# Patient Record
Sex: Female | Born: 1970 | Race: White | Hispanic: No | Marital: Married | Smoking: Never smoker
Health system: Southern US, Community
[De-identification: ages and names within clinical notes are randomized; demographics above are authoritative.]

## PROBLEM LIST (undated history)

## (undated) DIAGNOSIS — I10 Essential (primary) hypertension: Secondary | ICD-10-CM

## (undated) DIAGNOSIS — Z8489 Family history of other specified conditions: Secondary | ICD-10-CM

## (undated) DIAGNOSIS — N939 Abnormal uterine and vaginal bleeding, unspecified: Secondary | ICD-10-CM

## (undated) DIAGNOSIS — K219 Gastro-esophageal reflux disease without esophagitis: Secondary | ICD-10-CM

## (undated) DIAGNOSIS — D649 Anemia, unspecified: Secondary | ICD-10-CM

## (undated) DIAGNOSIS — Z87442 Personal history of urinary calculi: Secondary | ICD-10-CM

## (undated) DIAGNOSIS — Z973 Presence of spectacles and contact lenses: Secondary | ICD-10-CM

## (undated) DIAGNOSIS — Z972 Presence of dental prosthetic device (complete) (partial): Secondary | ICD-10-CM

## (undated) DIAGNOSIS — E039 Hypothyroidism, unspecified: Secondary | ICD-10-CM

## (undated) DIAGNOSIS — F909 Attention-deficit hyperactivity disorder, unspecified type: Secondary | ICD-10-CM

## (undated) DIAGNOSIS — Z9889 Other specified postprocedural states: Secondary | ICD-10-CM

## (undated) HISTORY — DX: Hypothyroidism, unspecified: E03.9

## (undated) HISTORY — PX: COSMETIC SURGERY: SHX468

## (undated) HISTORY — DX: Essential (primary) hypertension: I10

---

## 1999-06-20 ENCOUNTER — Other Ambulatory Visit: Admission: RE | Admit: 1999-06-20 | Discharge: 1999-06-20 | Payer: Self-pay | Admitting: Obstetrics and Gynecology

## 2000-01-19 ENCOUNTER — Inpatient Hospital Stay (HOSPITAL_COMMUNITY): Admission: AD | Admit: 2000-01-19 | Discharge: 2000-01-19 | Payer: Self-pay | Admitting: Obstetrics and Gynecology

## 2000-01-25 ENCOUNTER — Encounter: Payer: Self-pay | Admitting: Obstetrics & Gynecology

## 2000-01-25 ENCOUNTER — Inpatient Hospital Stay (HOSPITAL_COMMUNITY): Admission: AD | Admit: 2000-01-25 | Discharge: 2000-01-25 | Payer: Self-pay | Admitting: Obstetrics & Gynecology

## 2000-03-03 ENCOUNTER — Inpatient Hospital Stay (HOSPITAL_COMMUNITY): Admission: AD | Admit: 2000-03-03 | Discharge: 2000-03-06 | Payer: Self-pay | Admitting: Obstetrics and Gynecology

## 2000-03-29 ENCOUNTER — Other Ambulatory Visit: Admission: RE | Admit: 2000-03-29 | Discharge: 2000-03-29 | Payer: Self-pay | Admitting: Obstetrics and Gynecology

## 2001-06-20 ENCOUNTER — Other Ambulatory Visit: Admission: RE | Admit: 2001-06-20 | Discharge: 2001-06-20 | Payer: Self-pay | Admitting: Obstetrics and Gynecology

## 2002-07-03 ENCOUNTER — Other Ambulatory Visit: Admission: RE | Admit: 2002-07-03 | Discharge: 2002-07-03 | Payer: Self-pay | Admitting: Obstetrics and Gynecology

## 2002-09-18 ENCOUNTER — Encounter: Admission: RE | Admit: 2002-09-18 | Discharge: 2002-09-18 | Payer: Self-pay | Admitting: Pediatrics

## 2002-09-18 ENCOUNTER — Encounter: Payer: Self-pay | Admitting: Pediatrics

## 2002-10-31 ENCOUNTER — Encounter: Payer: Self-pay | Admitting: Gastroenterology

## 2002-10-31 ENCOUNTER — Ambulatory Visit (HOSPITAL_COMMUNITY): Admission: RE | Admit: 2002-10-31 | Discharge: 2002-10-31 | Payer: Self-pay | Admitting: Gastroenterology

## 2002-11-07 ENCOUNTER — Ambulatory Visit (HOSPITAL_COMMUNITY): Admission: RE | Admit: 2002-11-07 | Discharge: 2002-11-07 | Payer: Self-pay | Admitting: Gastroenterology

## 2002-11-07 ENCOUNTER — Encounter (INDEPENDENT_AMBULATORY_CARE_PROVIDER_SITE_OTHER): Payer: Self-pay | Admitting: Specialist

## 2008-10-29 ENCOUNTER — Encounter: Admission: RE | Admit: 2008-10-29 | Discharge: 2008-10-29 | Payer: Self-pay | Admitting: Obstetrics and Gynecology

## 2010-06-06 ENCOUNTER — Encounter: Admission: RE | Admit: 2010-06-06 | Discharge: 2010-06-06 | Payer: Self-pay | Admitting: Obstetrics and Gynecology

## 2011-01-23 NOTE — Op Note (Signed)
[facility]  Patient:    Brandy Jimenez, Brandy Jimenez                   MRN: [id number] Proc. Date: 03/03/00 Attending:  Lenoard Aden                           Operative Report  PREOPERATIVE DIAGNOSIS:       Previous cesarean section.  38-week OB intrauterine pregnancy.  Desire for elective repeat.  POSTOPERATIVE DIAGNOSIS:      Previous cesarean section.  38-week OB intrauterine pregnancy.  Desire for elective repeat.  Plus pedunculated posterior uterine fibroid.  OPERATION:                    Repeat low transverse cesarean section.  SURGEON:                      Lenoard Aden, M.D.  ASSISTANT:                    Sheronette A. Cherly Hensen, M.D.  ANESTHESIA:                   Spinal by Gretta Cool., M.D.  ESTIMATED BLOOD LOSS:         1000 cc.  COMPLICATIONS:                None.  DRAINS:                       Foley.  COUNTS:                       Correct.  CONDITION:                    The patient to the recovery room in good condition.  FINDINGS:                     Fullterm living female in vertex position.  Apgars  and 9.  Pediatricians in attendance.  Placenta manually intact.  Three vessel cord noted.  Posterior pedunculated subserosal uterine fibroid with firm adhesions to the sigmoid colon in the posterior cul-de-sac.  DESCRIPTION OF PROCEDURE:     After being apprised of the risks of anesthesia, infection, bleeding, injury to abdominal organs with need for repair, the patient was brought to the operating room where she was prepped and draped in the usual  sterile fashion.  After administering spinal anesthetic, Foley catheter was placed. After this, achieving adequate anesthesia, a Pfannenstiel skin incision was made with the scalpel and carried down to the fascia which was nicked in the midline and opened transversely using Mayo scissors.  The rectus muscles were dissected sharply in the midline.  The peritoneum  entered sharply.  The bladder blade placed. The bladder flap was developed using Metzenbaum scissors sharply.  The uterus was then scored in a smile-like fashion.  Amniotomy with clear fluid.  Delivery of a fullterm living female handed to pediatricians in attendance, 7 pounds 9 ounces, receiving Apgars of 8 and 9.  Placenta delivered manually intact, three vessel ord noted.  The uterus exteriorized.  Pedunculated fibroid as noted above.  Normal tubes and ovaries.  At this time, the uterus and pericolic gutters irrigated of all blood clots subsequently removed.  The fascia was closed with 0 Vicryl in  a continuous running fashion.  The skin was closed using staples.  Dilute Marcaine solution placed.  2-0 plain suture placed in the subcutaneous tissue.  The cervix was dilated using a sponge forcep postdelivery. DD:  03/03/00 TD:  03/04/00 Job: [id number]
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
## 2011-01-23 NOTE — Discharge Summary (Signed)
[facility]  Patient:    Brandy Jimenez, Brandy Jimenez                   MRN: [id number] Adm. Date:  [id number] Disc. Date: [id number] Attending:  Lenoard Aden                           Discharge Summary  HOSPITAL COURSE:              The patient underwent uncomplicated repeat C-section March 03, 2000.  Postpartum course uncomplicated.  Hemoglobin and hematocrit within normal limits, incision healing well, tolerating a regular diet well.  Discharged to home on day 3 on prenatal vitamins, iron, and Tylox #30.  FOLLOW-UP:  In the office in one week for blood pressure check. DD:  04/10/00 TD:  04/12/00 Job: [id number]

## 2011-07-24 ENCOUNTER — Other Ambulatory Visit: Payer: Self-pay | Admitting: Family Medicine

## 2011-07-27 ENCOUNTER — Ambulatory Visit
Admission: RE | Admit: 2011-07-27 | Discharge: 2011-07-27 | Disposition: A | Discharge status: Home / Self Care | Payer: BC Managed Care – PPO | Source: Ambulatory Visit | Attending: Family Medicine | Admitting: Family Medicine

## 2011-07-27 ENCOUNTER — Other Ambulatory Visit: Payer: Self-pay

## 2011-09-14 ENCOUNTER — Encounter (INDEPENDENT_AMBULATORY_CARE_PROVIDER_SITE_OTHER): Payer: BC Managed Care – PPO | Admitting: Family Medicine

## 2011-09-14 DIAGNOSIS — I10 Essential (primary) hypertension: Secondary | ICD-10-CM

## 2011-10-04 DIAGNOSIS — I10 Essential (primary) hypertension: Secondary | ICD-10-CM | POA: Insufficient documentation

## 2011-10-04 DIAGNOSIS — E669 Obesity, unspecified: Secondary | ICD-10-CM

## 2011-10-26 ENCOUNTER — Ambulatory Visit (INDEPENDENT_AMBULATORY_CARE_PROVIDER_SITE_OTHER): Payer: BC Managed Care – PPO | Admitting: Family Medicine

## 2011-10-26 ENCOUNTER — Encounter: Payer: Self-pay | Admitting: Family Medicine

## 2011-10-26 VITALS — BP 128/88 | Temp 98.2°F | Ht 63.25 in | Wt 232.6 lb

## 2011-10-26 DIAGNOSIS — G4700 Insomnia, unspecified: Secondary | ICD-10-CM

## 2011-10-26 DIAGNOSIS — I10 Essential (primary) hypertension: Secondary | ICD-10-CM

## 2011-10-26 MED ORDER — ZOLPIDEM TARTRATE 5 MG PO TABS: 5.0000 mg | ORAL_TABLET | Freq: Every evening | ORAL | Status: DC | PRN

## 2011-10-26 NOTE — Progress Notes (Signed)
  Subjective:    Patient ID: Brandy Jimenez, female    DOB: 02/21/1971, 41 y.o.   MRN: [id number]  HPI Patient presents in routine follow up of HTN. Compliant with medications without S/E Beginning to exercise Monitoring sodium adn cholesterol in diet.  Complains of insomnia and would like sleep agent Denies depressive symptoms, RLS or snoring associated with daytime somnolence    Review of Systems     Objective:   Physical Exam  Constitutional: She appears well-developed and well-nourished.  Neck: Neck supple.  Cardiovascular: Normal rate, regular rhythm and normal heart sounds.   Pulmonary/Chest: Effort normal and breath sounds normal.  Neurological: She is alert.  Skin: Skin is warm.          Assessment & Plan:  HTN Insomnia  Doing well RF medications for 6 months  Ambien 5 mg qhs prn Will see patient back in August

## 2011-10-30 ENCOUNTER — Other Ambulatory Visit: Payer: Self-pay | Admitting: Obstetrics and Gynecology

## 2011-10-30 DIAGNOSIS — Z1231 Encounter for screening mammogram for malignant neoplasm of breast: Secondary | ICD-10-CM

## 2011-11-09 ENCOUNTER — Ambulatory Visit: Payer: BC Managed Care – PPO

## 2011-11-16 ENCOUNTER — Ambulatory Visit
Admission: RE | Admit: 2011-11-16 | Discharge: 2011-11-16 | Disposition: A | Discharge status: Home / Self Care | Payer: BC Managed Care – PPO | Source: Ambulatory Visit | Attending: Obstetrics and Gynecology | Admitting: Obstetrics and Gynecology

## 2011-11-16 DIAGNOSIS — Z1231 Encounter for screening mammogram for malignant neoplasm of breast: Secondary | ICD-10-CM

## 2012-03-18 ENCOUNTER — Other Ambulatory Visit: Payer: Self-pay | Admitting: *Deleted

## 2012-03-18 MED ORDER — LOSARTAN POTASSIUM-HCTZ 100-12.5 MG PO TABS: 1.0000 | ORAL_TABLET | Freq: Every day | ORAL | Status: DC

## 2012-03-18 MED ORDER — VERAPAMIL HCL ER 120 MG PO CP24: 120.0000 mg | ORAL_CAPSULE | Freq: Every day | ORAL | Status: DC

## 2012-04-18 ENCOUNTER — Ambulatory Visit: Payer: BC Managed Care – PPO | Admitting: Family Medicine

## 2014-06-05 ENCOUNTER — Emergency Department (HOSPITAL_COMMUNITY)
Admission: EM | Admit: 2014-06-05 | Discharge: 2014-06-05 | Disposition: A | Discharge status: Home / Self Care | Payer: BC Managed Care – PPO | Attending: Emergency Medicine | Admitting: Emergency Medicine

## 2014-06-05 ENCOUNTER — Encounter (HOSPITAL_COMMUNITY): Payer: Self-pay | Admitting: Emergency Medicine

## 2014-06-05 DIAGNOSIS — R030 Elevated blood-pressure reading, without diagnosis of hypertension: Secondary | ICD-10-CM | POA: Diagnosis present

## 2014-06-05 DIAGNOSIS — I10 Essential (primary) hypertension: Secondary | ICD-10-CM | POA: Diagnosis not present

## 2014-06-05 DIAGNOSIS — H1130 Conjunctival hemorrhage, unspecified eye: Secondary | ICD-10-CM | POA: Insufficient documentation

## 2014-06-05 LAB — I-STAT CHEM 8, ED
BUN: 10 mg/dL (ref 6–23)
CALCIUM ION: 1.02 mmol/L — AB (ref 1.12–1.23)
Chloride: 102 mEq/L (ref 96–112)
Creatinine, Ser: 0.6 mg/dL (ref 0.50–1.10)
Glucose, Bld: 112 mg/dL — ABNORMAL HIGH (ref 70–99)
HCT: 37 % (ref 36.0–46.0)
HEMOGLOBIN: 12.6 g/dL (ref 12.0–15.0)
Potassium: 3.5 mEq/L — ABNORMAL LOW (ref 3.7–5.3)
SODIUM: 140 meq/L (ref 137–147)
TCO2: 24 mmol/L (ref 0–100)

## 2014-06-05 MED ORDER — LISINOPRIL 10 MG PO TABS: 10.0000 mg | ORAL_TABLET | Freq: Every day | ORAL | Status: DC

## 2014-06-05 MED ORDER — LISINOPRIL 10 MG PO TABS
10.0000 mg | ORAL_TABLET | Freq: Once | ORAL | Status: AC
  Administered 2014-06-05: 10 mg via ORAL
  Filled 2014-06-05: qty 1
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
## 2014-06-05 NOTE — ED Provider Notes (Signed)
43 year old female presents with asymptomatic hypertension. She was seen at the ophthalmologist earlier in the day because of subconjunctival hemorrhage, noted to be severely hypertensive over 270 systolic, states that she's been having no other symptoms including no neurologic complaints, no chest pain shortness of breath or trouble urinating. No headaches, no blurred vision. On exam the patient has a soft abdomen, clear lung sounds without murmurs, normal strength sensation speech and coordination. We'll check renal function prior to starting lisinopril, the patient was given the risks and benefits of this medication including the small risk of angioedema and has agreed. Her last blood pressure was 160/98, while this may be situational as her mother is admitted upstairs in the hospital at this time we'll start a low dose and have the patient follow up closely. She is in agreement with the plan.  Filed Vitals:   06/05/14 1905 06/05/14 1945 06/05/14 2015  BP: 174/110 172/76 166/92  Pulse: 107 94 88  Temp: 98.3 F (36.8 C)    TempSrc: Oral    Resp: 18 25 14   SpO2: 100% 100% 100%    Meds given in ED:  Medications  lisinopril (PRINIVIL,ZESTRIL) tablet 10 mg (10 mg Oral Given 06/05/14 2115)    New Prescriptions   LISINOPRIL (PRINIVIL,ZESTRIL) 10 MG TABLET    Take 1 tablet (10 mg total) by mouth daily.      I saw and evaluated the patient, reviewed the resident's note and I agree with the findings and plan.    [name], MD 06/05/14 2149

## 2014-06-05 NOTE — ED Notes (Signed)
Pt. Refused wheelchair 

## 2014-06-05 NOTE — Discharge Instructions (Signed)
Please call your doctor for a followup appointment within 24-48 hours. When you talk to your doctor please let them know that you were seen in the emergency department and have them acquire all of your records so that they can discuss the findings with you and formulate a treatment plan to fully care for your new and ongoing problems. ° °
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
## 2014-06-06 NOTE — ED Provider Notes (Signed)
I saw and evaluated the patient, reviewed the resident's note and I agree with the findings and plan.  Please see my separate note regarding my evaluation of the patient.    [name], MD 06/06/14 [phone]

## 2015-06-17 ENCOUNTER — Other Ambulatory Visit: Payer: Self-pay

## 2015-06-17 DIAGNOSIS — Z1231 Encounter for screening mammogram for malignant neoplasm of breast: Secondary | ICD-10-CM

## 2015-07-08 ENCOUNTER — Ambulatory Visit
Admission: RE | Admit: 2015-07-08 | Discharge: 2015-07-08 | Disposition: A | Discharge status: Home / Self Care | Payer: BC Managed Care – PPO | Source: Ambulatory Visit

## 2015-07-08 DIAGNOSIS — Z1231 Encounter for screening mammogram for malignant neoplasm of breast: Secondary | ICD-10-CM

## 2016-06-19 ENCOUNTER — Other Ambulatory Visit: Payer: Self-pay | Admitting: Obstetrics and Gynecology

## 2016-06-19 DIAGNOSIS — Z1231 Encounter for screening mammogram for malignant neoplasm of breast: Secondary | ICD-10-CM

## 2016-07-17 ENCOUNTER — Ambulatory Visit
Admission: RE | Admit: 2016-07-17 | Discharge: 2016-07-17 | Disposition: A | Discharge status: Home / Self Care | Payer: BC Managed Care – PPO | Source: Ambulatory Visit | Attending: Obstetrics and Gynecology | Admitting: Obstetrics and Gynecology

## 2016-07-17 DIAGNOSIS — Z1231 Encounter for screening mammogram for malignant neoplasm of breast: Secondary | ICD-10-CM

## 2016-08-13 LAB — HM COLONOSCOPY

## 2017-03-11 DIAGNOSIS — R799 Abnormal finding of blood chemistry, unspecified: Secondary | ICD-10-CM | POA: Diagnosis not present

## 2017-03-11 DIAGNOSIS — E559 Vitamin D deficiency, unspecified: Secondary | ICD-10-CM | POA: Diagnosis not present

## 2017-03-11 DIAGNOSIS — M25551 Pain in right hip: Secondary | ICD-10-CM | POA: Diagnosis not present

## 2017-03-11 DIAGNOSIS — E782 Mixed hyperlipidemia: Secondary | ICD-10-CM | POA: Diagnosis not present

## 2017-03-11 DIAGNOSIS — I10 Essential (primary) hypertension: Secondary | ICD-10-CM | POA: Diagnosis not present

## 2017-06-17 DIAGNOSIS — R5383 Other fatigue: Secondary | ICD-10-CM | POA: Diagnosis not present

## 2017-06-17 DIAGNOSIS — I10 Essential (primary) hypertension: Secondary | ICD-10-CM | POA: Diagnosis not present

## 2017-06-17 DIAGNOSIS — E038 Other specified hypothyroidism: Secondary | ICD-10-CM | POA: Diagnosis not present

## 2017-06-17 DIAGNOSIS — E559 Vitamin D deficiency, unspecified: Secondary | ICD-10-CM | POA: Diagnosis not present

## 2017-06-17 DIAGNOSIS — R799 Abnormal finding of blood chemistry, unspecified: Secondary | ICD-10-CM | POA: Diagnosis not present

## 2017-06-17 DIAGNOSIS — D508 Other iron deficiency anemias: Secondary | ICD-10-CM | POA: Diagnosis not present

## 2017-06-28 ENCOUNTER — Other Ambulatory Visit: Payer: Self-pay | Admitting: Obstetrics and Gynecology

## 2017-06-28 DIAGNOSIS — Z1231 Encounter for screening mammogram for malignant neoplasm of breast: Secondary | ICD-10-CM

## 2017-07-19 ENCOUNTER — Ambulatory Visit
Admission: RE | Admit: 2017-07-19 | Discharge: 2017-07-19 | Disposition: A | Discharge status: Home / Self Care | Payer: BLUE CROSS/BLUE SHIELD | Source: Ambulatory Visit | Attending: Obstetrics and Gynecology | Admitting: Obstetrics and Gynecology

## 2017-07-19 DIAGNOSIS — Z1231 Encounter for screening mammogram for malignant neoplasm of breast: Secondary | ICD-10-CM | POA: Diagnosis not present

## 2017-08-20 DIAGNOSIS — R21 Rash and other nonspecific skin eruption: Secondary | ICD-10-CM | POA: Diagnosis not present

## 2017-10-15 DIAGNOSIS — D508 Other iron deficiency anemias: Secondary | ICD-10-CM | POA: Diagnosis not present

## 2017-10-15 DIAGNOSIS — E038 Other specified hypothyroidism: Secondary | ICD-10-CM | POA: Diagnosis not present

## 2017-10-15 DIAGNOSIS — E559 Vitamin D deficiency, unspecified: Secondary | ICD-10-CM | POA: Diagnosis not present

## 2017-10-17 DIAGNOSIS — J411 Mucopurulent chronic bronchitis: Secondary | ICD-10-CM | POA: Diagnosis not present

## 2017-12-23 DIAGNOSIS — J209 Acute bronchitis, unspecified: Secondary | ICD-10-CM | POA: Diagnosis not present

## 2018-02-07 DIAGNOSIS — H15102 Unspecified episcleritis, left eye: Secondary | ICD-10-CM | POA: Diagnosis not present

## 2018-02-17 DIAGNOSIS — E038 Other specified hypothyroidism: Secondary | ICD-10-CM | POA: Diagnosis not present

## 2018-02-17 DIAGNOSIS — D508 Other iron deficiency anemias: Secondary | ICD-10-CM | POA: Diagnosis not present

## 2018-02-17 DIAGNOSIS — I10 Essential (primary) hypertension: Secondary | ICD-10-CM | POA: Diagnosis not present

## 2018-02-17 DIAGNOSIS — E559 Vitamin D deficiency, unspecified: Secondary | ICD-10-CM | POA: Diagnosis not present

## 2018-03-14 DIAGNOSIS — R109 Unspecified abdominal pain: Secondary | ICD-10-CM | POA: Diagnosis not present

## 2018-03-14 DIAGNOSIS — N200 Calculus of kidney: Secondary | ICD-10-CM | POA: Diagnosis not present

## 2018-06-27 DIAGNOSIS — L821 Other seborrheic keratosis: Secondary | ICD-10-CM | POA: Diagnosis not present

## 2018-06-27 DIAGNOSIS — L700 Acne vulgaris: Secondary | ICD-10-CM | POA: Diagnosis not present

## 2018-10-27 DIAGNOSIS — R799 Abnormal finding of blood chemistry, unspecified: Secondary | ICD-10-CM | POA: Diagnosis not present

## 2018-10-27 DIAGNOSIS — E038 Other specified hypothyroidism: Secondary | ICD-10-CM | POA: Diagnosis not present

## 2018-10-27 DIAGNOSIS — E782 Mixed hyperlipidemia: Secondary | ICD-10-CM | POA: Diagnosis not present

## 2018-10-27 DIAGNOSIS — E559 Vitamin D deficiency, unspecified: Secondary | ICD-10-CM | POA: Diagnosis not present

## 2018-10-27 DIAGNOSIS — I10 Essential (primary) hypertension: Secondary | ICD-10-CM | POA: Diagnosis not present

## 2018-10-27 DIAGNOSIS — D508 Other iron deficiency anemias: Secondary | ICD-10-CM | POA: Diagnosis not present

## 2018-11-09 ENCOUNTER — Other Ambulatory Visit: Payer: Self-pay | Admitting: Obstetrics and Gynecology

## 2018-11-09 DIAGNOSIS — Z1231 Encounter for screening mammogram for malignant neoplasm of breast: Secondary | ICD-10-CM

## 2018-11-28 ENCOUNTER — Ambulatory Visit: Payer: BLUE CROSS/BLUE SHIELD

## 2018-12-12 DIAGNOSIS — Z124 Encounter for screening for malignant neoplasm of cervix: Secondary | ICD-10-CM | POA: Diagnosis not present

## 2018-12-12 DIAGNOSIS — Z1151 Encounter for screening for human papillomavirus (HPV): Secondary | ICD-10-CM | POA: Diagnosis not present

## 2018-12-12 DIAGNOSIS — Z01419 Encounter for gynecological examination (general) (routine) without abnormal findings: Secondary | ICD-10-CM | POA: Diagnosis not present

## 2018-12-12 DIAGNOSIS — Z6839 Body mass index (BMI) 39.0-39.9, adult: Secondary | ICD-10-CM | POA: Diagnosis not present

## 2019-01-02 ENCOUNTER — Ambulatory Visit: Payer: BLUE CROSS/BLUE SHIELD

## 2019-01-25 DIAGNOSIS — E038 Other specified hypothyroidism: Secondary | ICD-10-CM | POA: Diagnosis not present

## 2019-02-16 ENCOUNTER — Ambulatory Visit
Admission: RE | Admit: 2019-02-16 | Discharge: 2019-02-16 | Disposition: A | Discharge status: Home / Self Care | Payer: BC Managed Care – PPO | Source: Ambulatory Visit | Attending: Obstetrics and Gynecology | Admitting: Obstetrics and Gynecology

## 2019-02-16 ENCOUNTER — Other Ambulatory Visit: Payer: Self-pay

## 2019-02-16 DIAGNOSIS — Z1231 Encounter for screening mammogram for malignant neoplasm of breast: Secondary | ICD-10-CM | POA: Diagnosis not present

## 2019-04-19 DIAGNOSIS — S81811A Laceration without foreign body, right lower leg, initial encounter: Secondary | ICD-10-CM | POA: Diagnosis not present

## 2019-04-19 DIAGNOSIS — Z23 Encounter for immunization: Secondary | ICD-10-CM | POA: Diagnosis not present

## 2019-04-19 DIAGNOSIS — Y93H9 Activity, other involving exterior property and land maintenance, building and construction: Secondary | ICD-10-CM | POA: Diagnosis not present

## 2019-04-19 DIAGNOSIS — I10 Essential (primary) hypertension: Secondary | ICD-10-CM | POA: Diagnosis not present

## 2019-04-19 DIAGNOSIS — W269XXA Contact with unspecified sharp object(s), initial encounter: Secondary | ICD-10-CM | POA: Diagnosis not present

## 2019-04-19 DIAGNOSIS — E039 Hypothyroidism, unspecified: Secondary | ICD-10-CM | POA: Diagnosis not present

## 2019-09-22 DIAGNOSIS — E782 Mixed hyperlipidemia: Secondary | ICD-10-CM | POA: Diagnosis not present

## 2019-09-22 DIAGNOSIS — E559 Vitamin D deficiency, unspecified: Secondary | ICD-10-CM | POA: Diagnosis not present

## 2019-09-22 DIAGNOSIS — D508 Other iron deficiency anemias: Secondary | ICD-10-CM | POA: Diagnosis not present

## 2019-09-22 DIAGNOSIS — I10 Essential (primary) hypertension: Secondary | ICD-10-CM | POA: Diagnosis not present

## 2019-09-22 DIAGNOSIS — E038 Other specified hypothyroidism: Secondary | ICD-10-CM | POA: Diagnosis not present

## 2019-11-01 ENCOUNTER — Telehealth: Payer: Self-pay

## 2019-11-01 ENCOUNTER — Other Ambulatory Visit: Payer: Self-pay | Admitting: Physician Assistant

## 2019-11-01 ENCOUNTER — Other Ambulatory Visit: Payer: Self-pay

## 2019-11-01 MED ORDER — LEVOTHYROXINE SODIUM 75 MCG PO TABS: 75.0000 ug | ORAL_TABLET | Freq: Every day | ORAL | 1 refills | Status: DC

## 2019-11-01 MED ORDER — LEVOTHYROXINE SODIUM 75 MCG PO TABS: 75.0000 ug | ORAL_TABLET | Freq: Every day | ORAL | 0 refills | Status: DC

## 2019-11-01 NOTE — Telephone Encounter (Signed)
Sent to both 

## 2019-11-06 ENCOUNTER — Other Ambulatory Visit: Payer: Self-pay | Admitting: Physician Assistant

## 2020-04-11 ENCOUNTER — Other Ambulatory Visit: Payer: Self-pay | Admitting: Physician Assistant

## 2020-07-01 ENCOUNTER — Other Ambulatory Visit: Payer: Self-pay | Admitting: Obstetrics and Gynecology

## 2020-07-01 DIAGNOSIS — Z1231 Encounter for screening mammogram for malignant neoplasm of breast: Secondary | ICD-10-CM

## 2020-07-10 ENCOUNTER — Other Ambulatory Visit: Payer: Self-pay | Admitting: Physician Assistant

## 2020-08-13 ENCOUNTER — Ambulatory Visit
Admission: RE | Admit: 2020-08-13 | Discharge: 2020-08-13 | Disposition: A | Discharge status: Home / Self Care | Payer: BC Managed Care – PPO | Source: Ambulatory Visit | Attending: Obstetrics and Gynecology | Admitting: Obstetrics and Gynecology

## 2020-08-13 ENCOUNTER — Other Ambulatory Visit: Payer: Self-pay

## 2020-08-13 DIAGNOSIS — Z1231 Encounter for screening mammogram for malignant neoplasm of breast: Secondary | ICD-10-CM

## 2020-08-26 ENCOUNTER — Ambulatory Visit: Payer: BC Managed Care – PPO

## 2020-09-02 DIAGNOSIS — Z20828 Contact with and (suspected) exposure to other viral communicable diseases: Secondary | ICD-10-CM | POA: Diagnosis not present

## 2020-09-02 DIAGNOSIS — J329 Chronic sinusitis, unspecified: Secondary | ICD-10-CM | POA: Diagnosis not present

## 2020-09-03 ENCOUNTER — Other Ambulatory Visit: Payer: Self-pay | Admitting: Physician Assistant

## 2020-10-31 ENCOUNTER — Other Ambulatory Visit: Payer: Self-pay | Admitting: Physician Assistant

## 2020-12-30 DIAGNOSIS — S0232XS Fracture of orbital floor, left side, sequela: Secondary | ICD-10-CM | POA: Diagnosis not present

## 2020-12-30 DIAGNOSIS — H02535 Eyelid retraction left lower eyelid: Secondary | ICD-10-CM | POA: Diagnosis not present

## 2020-12-30 DIAGNOSIS — H5712 Ocular pain, left eye: Secondary | ICD-10-CM | POA: Diagnosis not present

## 2021-01-09 DIAGNOSIS — M7989 Other specified soft tissue disorders: Secondary | ICD-10-CM | POA: Diagnosis not present

## 2021-01-09 DIAGNOSIS — J3489 Other specified disorders of nose and nasal sinuses: Secondary | ICD-10-CM | POA: Diagnosis not present

## 2021-01-09 DIAGNOSIS — J320 Chronic maxillary sinusitis: Secondary | ICD-10-CM | POA: Diagnosis not present

## 2021-01-09 DIAGNOSIS — M799 Soft tissue disorder, unspecified: Secondary | ICD-10-CM | POA: Diagnosis not present

## 2021-01-09 DIAGNOSIS — S0232XS Fracture of orbital floor, left side, sequela: Secondary | ICD-10-CM | POA: Diagnosis not present

## 2021-01-09 DIAGNOSIS — J321 Chronic frontal sinusitis: Secondary | ICD-10-CM | POA: Diagnosis not present

## 2021-02-18 DIAGNOSIS — M79672 Pain in left foot: Secondary | ICD-10-CM | POA: Diagnosis not present

## 2021-02-24 ENCOUNTER — Other Ambulatory Visit: Payer: Self-pay

## 2021-02-24 ENCOUNTER — Ambulatory Visit (INDEPENDENT_AMBULATORY_CARE_PROVIDER_SITE_OTHER): Payer: BC Managed Care – PPO

## 2021-02-24 ENCOUNTER — Ambulatory Visit: Payer: BC Managed Care – PPO | Admitting: Physician Assistant

## 2021-02-24 ENCOUNTER — Encounter: Payer: Self-pay | Admitting: Physician Assistant

## 2021-02-24 VITALS — BP 124/84 | HR 72 | Temp 97.2°F | Resp 16 | Ht 62.5 in | Wt 238.2 lb

## 2021-02-24 DIAGNOSIS — E039 Hypothyroidism, unspecified: Secondary | ICD-10-CM | POA: Diagnosis not present

## 2021-02-24 DIAGNOSIS — H5712 Ocular pain, left eye: Secondary | ICD-10-CM | POA: Diagnosis not present

## 2021-02-24 DIAGNOSIS — Z23 Encounter for immunization: Secondary | ICD-10-CM | POA: Diagnosis not present

## 2021-02-24 DIAGNOSIS — S0232XS Fracture of orbital floor, left side, sequela: Secondary | ICD-10-CM | POA: Diagnosis not present

## 2021-02-24 DIAGNOSIS — H02535 Eyelid retraction left lower eyelid: Secondary | ICD-10-CM | POA: Diagnosis not present

## 2021-02-24 DIAGNOSIS — I10 Essential (primary) hypertension: Secondary | ICD-10-CM

## 2021-02-24 DIAGNOSIS — E559 Vitamin D deficiency, unspecified: Secondary | ICD-10-CM
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
## 2021-02-24 NOTE — Progress Notes (Signed)
   Covid-19 Vaccination Clinic  Name:  Brandy Jimenez    MRN: [id number] DOB: [birth year]  02/24/2021  Brandy Jimenez was observed post Covid-19 immunization for 15 minutes without incident. She was provided with Vaccine Information Sheet and instruction to access the V-Safe system.   Brandy Jimenez was instructed to call [phone] with any severe reactions post vaccine: Difficulty breathing  Swelling of face and throat  A fast heartbeat  A bad rash all over body  Dizziness and weakness

## 2021-02-25 ENCOUNTER — Other Ambulatory Visit: Payer: Self-pay

## 2021-02-25 LAB — LIPID PANEL
Chol/HDL Ratio: 3.9 ratio (ref 0.0–4.4)
Cholesterol, Total: 209 mg/dL — ABNORMAL HIGH (ref 100–199)
HDL: 53 mg/dL (ref >39)
LDL Chol Calc (NIH): 133 mg/dL — ABNORMAL HIGH (ref 0–99)
Triglycerides: 128 mg/dL (ref 0–149)
VLDL Cholesterol Cal: 23 mg/dL (ref 5–40)

## 2021-02-25 LAB — CBC WITH DIFFERENTIAL/PLATELET
Basophils Absolute: 0.1 10*3/uL (ref 0.0–0.2)
Basos: 1 %
EOS (ABSOLUTE): 0.1 10*3/uL (ref 0.0–0.4)
Eos: 1 %
Hematocrit: 42.6 % (ref 34.0–46.6)
Hemoglobin: 13.4 g/dL (ref 11.1–15.9)
Immature Grans (Abs): 0.1 10*3/uL (ref 0.0–0.1)
Immature Granulocytes: 1 %
Lymphocytes Absolute: 2.3 10*3/uL (ref 0.7–3.1)
Lymphs: 25 %
MCH: 25.5 pg — ABNORMAL LOW (ref 26.6–33.0)
MCHC: 31.5 g/dL (ref 31.5–35.7)
MCV: 81 fL (ref 79–97)
Monocytes Absolute: 0.5 10*3/uL (ref 0.1–0.9)
Monocytes: 5 %
Neutrophils Absolute: 6.2 10*3/uL (ref 1.4–7.0)
Neutrophils: 67 %
Platelets: 271 10*3/uL (ref 150–450)
RBC: 5.26 x10E6/uL (ref 3.77–5.28)
RDW: 14.5 % (ref 11.7–15.4)
WBC: 9.3 10*3/uL (ref 3.4–10.8)

## 2021-02-25 LAB — COMPREHENSIVE METABOLIC PANEL
ALT: 28 IU/L (ref 0–32)
AST: 22 IU/L (ref 0–40)
Albumin/Globulin Ratio: 2 (ref 1.2–2.2)
Albumin: 4.9 g/dL (ref 3.8–4.9)
Alkaline Phosphatase: 135 IU/L — ABNORMAL HIGH (ref 44–121)
BUN/Creatinine Ratio: 24 — ABNORMAL HIGH (ref 9–23)
BUN: 13 mg/dL (ref 6–24)
Bilirubin Total: 0.4 mg/dL (ref 0.0–1.2)
CO2: 24 mmol/L (ref 20–29)
Calcium: 9.4 mg/dL (ref 8.7–10.2)
Chloride: 99 mmol/L (ref 96–106)
Creatinine, Ser: 0.54 mg/dL — ABNORMAL LOW (ref 0.57–1.00)
Globulin, Total: 2.5 g/dL (ref 1.5–4.5)
Glucose: 84 mg/dL (ref 65–99)
Potassium: 4.4 mmol/L (ref 3.5–5.2)
Sodium: 138 mmol/L (ref 134–144)
Total Protein: 7.4 g/dL (ref 6.0–8.5)
eGFR: 111 mL/min/{1.73_m2} (ref >59)

## 2021-02-25 LAB — CARDIOVASCULAR RISK ASSESSMENT

## 2021-02-25 LAB — TSH: TSH: 2.9 u[IU]/mL (ref 0.450–4.500)

## 2021-02-25 LAB — VITAMIN D 25 HYDROXY (VIT D DEFICIENCY, FRACTURES): Vit D, 25-Hydroxy: 30.5 ng/mL (ref 30.0–100.0)

## 2021-02-25 MED ORDER — ERGOCALCIFEROL 1.25 MG (50000 UT) PO CAPS: 50000.0000 [IU] | ORAL_CAPSULE | ORAL | 1 refills | Status: DC

## 2021-02-25 MED ORDER — LEVOTHYROXINE SODIUM 75 MCG PO TABS: 75.0000 ug | ORAL_TABLET | Freq: Every day | ORAL | 1 refills | Status: DC

## 2021-02-25 MED ORDER — LOSARTAN POTASSIUM 100 MG PO TABS: ORAL_TABLET | ORAL | 1 refills | Status: DC

## 2021-02-28 ENCOUNTER — Other Ambulatory Visit: Payer: Self-pay

## 2021-02-28 MED ORDER — LOSARTAN POTASSIUM 100 MG PO TABS: ORAL_TABLET | ORAL | 1 refills | Status: DC

## 2021-02-28 MED ORDER — LEVOTHYROXINE SODIUM 75 MCG PO TABS: 75.0000 ug | ORAL_TABLET | Freq: Every day | ORAL | 1 refills | Status: DC

## 2021-02-28 MED ORDER — ERGOCALCIFEROL 1.25 MG (50000 UT) PO CAPS: 50000.0000 [IU] | ORAL_CAPSULE | ORAL | 1 refills | Status: DC

## 2021-02-28 NOTE — Telephone Encounter (Signed)
Change in pharmacy.   Brandy Jimenez 02/28/21 8:51 AM

## 2021-08-15 ENCOUNTER — Other Ambulatory Visit: Payer: Self-pay | Admitting: Physician Assistant

## 2021-08-27 ENCOUNTER — Other Ambulatory Visit: Payer: Self-pay | Admitting: Physician Assistant

## 2021-09-04 ENCOUNTER — Other Ambulatory Visit: Payer: Self-pay

## 2021-09-04 MED ORDER — LEVOTHYROXINE SODIUM 75 MCG PO TABS: 75.0000 ug | ORAL_TABLET | Freq: Every day | ORAL | 0 refills | Status: DC

## 2021-09-04 MED ORDER — LOSARTAN POTASSIUM 100 MG PO TABS: ORAL_TABLET | ORAL | 0 refills | Status: DC

## 2021-09-09 ENCOUNTER — Encounter: Payer: Self-pay | Admitting: Physician Assistant

## 2021-09-09 ENCOUNTER — Ambulatory Visit: Payer: BC Managed Care – PPO | Admitting: Physician Assistant

## 2021-09-09 ENCOUNTER — Other Ambulatory Visit: Payer: Self-pay

## 2021-09-09 VITALS — BP 138/82 | HR 86 | Temp 98.7°F | Resp 18 | Ht 62.0 in | Wt 249.2 lb

## 2021-09-09 DIAGNOSIS — E039 Hypothyroidism, unspecified: Secondary | ICD-10-CM | POA: Diagnosis not present

## 2021-09-09 DIAGNOSIS — E559 Vitamin D deficiency, unspecified: Secondary | ICD-10-CM | POA: Diagnosis not present

## 2021-09-09 DIAGNOSIS — I10 Essential (primary) hypertension: Secondary | ICD-10-CM
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
## 2021-09-22 ENCOUNTER — Other Ambulatory Visit: Payer: BC Managed Care – PPO

## 2021-09-22 ENCOUNTER — Other Ambulatory Visit: Payer: Self-pay

## 2021-09-22 ENCOUNTER — Ambulatory Visit: Payer: BC Managed Care – PPO | Admitting: Physician Assistant

## 2021-09-22 DIAGNOSIS — E559 Vitamin D deficiency, unspecified: Secondary | ICD-10-CM

## 2021-09-22 DIAGNOSIS — E039 Hypothyroidism, unspecified: Secondary | ICD-10-CM

## 2021-09-22 DIAGNOSIS — I10 Essential (primary) hypertension: Secondary | ICD-10-CM

## 2021-09-23 LAB — COMPREHENSIVE METABOLIC PANEL
ALT: 27 IU/L (ref 0–32)
AST: 17 IU/L (ref 0–40)
Albumin/Globulin Ratio: 1.5 (ref 1.2–2.2)
Albumin: 4.4 g/dL (ref 3.8–4.9)
Alkaline Phosphatase: 131 IU/L — ABNORMAL HIGH (ref 44–121)
BUN/Creatinine Ratio: 23 (ref 9–23)
BUN: 12 mg/dL (ref 6–24)
Bilirubin Total: 0.3 mg/dL (ref 0.0–1.2)
CO2: 24 mmol/L (ref 20–29)
Calcium: 9.3 mg/dL (ref 8.7–10.2)
Chloride: 103 mmol/L (ref 96–106)
Creatinine, Ser: 0.53 mg/dL — ABNORMAL LOW (ref 0.57–1.00)
Globulin, Total: 2.9 g/dL (ref 1.5–4.5)
Glucose: 103 mg/dL — ABNORMAL HIGH (ref 70–99)
Potassium: 4.7 mmol/L (ref 3.5–5.2)
Sodium: 140 mmol/L (ref 134–144)
Total Protein: 7.3 g/dL (ref 6.0–8.5)
eGFR: 111 mL/min/{1.73_m2} (ref >59)

## 2021-09-23 LAB — CBC WITH DIFFERENTIAL/PLATELET
Basophils Absolute: 0 10*3/uL (ref 0.0–0.2)
Basos: 1 %
EOS (ABSOLUTE): 0.2 10*3/uL (ref 0.0–0.4)
Eos: 2 %
Hematocrit: 42.2 % (ref 34.0–46.6)
Hemoglobin: 13.9 g/dL (ref 11.1–15.9)
Immature Grans (Abs): 0 10*3/uL (ref 0.0–0.1)
Immature Granulocytes: 1 %
Lymphocytes Absolute: 2.5 10*3/uL (ref 0.7–3.1)
Lymphs: 31 %
MCH: 26.7 pg (ref 26.6–33.0)
MCHC: 32.9 g/dL (ref 31.5–35.7)
MCV: 81 fL (ref 79–97)
Monocytes Absolute: 0.5 10*3/uL (ref 0.1–0.9)
Monocytes: 6 %
Neutrophils Absolute: 4.9 10*3/uL (ref 1.4–7.0)
Neutrophils: 59 %
Platelets: 255 10*3/uL (ref 150–450)
RBC: 5.21 x10E6/uL (ref 3.77–5.28)
RDW: 13.3 % (ref 11.7–15.4)
WBC: 8.1 10*3/uL (ref 3.4–10.8)

## 2021-09-23 LAB — TSH: TSH: 3.36 u[IU]/mL (ref 0.450–4.500)

## 2021-09-23 LAB — LIPID PANEL
Chol/HDL Ratio: 4 ratio (ref 0.0–4.4)
Cholesterol, Total: 201 mg/dL — ABNORMAL HIGH (ref 100–199)
HDL: 50 mg/dL (ref >39)
LDL Chol Calc (NIH): 129 mg/dL — ABNORMAL HIGH (ref 0–99)
Triglycerides: 121 mg/dL (ref 0–149)
VLDL Cholesterol Cal: 22 mg/dL (ref 5–40)

## 2021-09-23 LAB — CARDIOVASCULAR RISK ASSESSMENT

## 2021-09-23 LAB — VITAMIN D 25 HYDROXY (VIT D DEFICIENCY, FRACTURES): Vit D, 25-Hydroxy: 36.9 ng/mL (ref 30.0–100.0)

## 2021-09-24 ENCOUNTER — Other Ambulatory Visit: Payer: Self-pay | Admitting: Physician Assistant

## 2021-09-24 MED ORDER — LEVOTHYROXINE SODIUM 75 MCG PO TABS: 75.0000 ug | ORAL_TABLET | Freq: Every day | ORAL | 1 refills | Status: DC

## 2021-09-24 MED ORDER — VITAMIN D (ERGOCALCIFEROL) 1.25 MG (50000 UNIT) PO CAPS: ORAL_CAPSULE | ORAL | 1 refills | Status: DC

## 2021-09-24 MED ORDER — LOSARTAN POTASSIUM 100 MG PO TABS: ORAL_TABLET | ORAL | 1 refills | Status: DC

## 2021-09-24 MED ORDER — ROSUVASTATIN CALCIUM 5 MG PO TABS: 5.0000 mg | ORAL_TABLET | Freq: Every day | ORAL | 2 refills | Status: DC

## 2021-12-18 ENCOUNTER — Encounter: Payer: Self-pay | Admitting: Physician Assistant

## 2021-12-18 ENCOUNTER — Ambulatory Visit: Payer: BC Managed Care – PPO | Admitting: Physician Assistant

## 2021-12-18 VITALS — BP 148/102 | HR 88 | Resp 18 | Ht 62.0 in | Wt 237.0 lb

## 2021-12-18 DIAGNOSIS — R739 Hyperglycemia, unspecified: Secondary | ICD-10-CM

## 2021-12-18 DIAGNOSIS — E782 Mixed hyperlipidemia: Secondary | ICD-10-CM

## 2021-12-18 DIAGNOSIS — I10 Essential (primary) hypertension: Secondary | ICD-10-CM | POA: Diagnosis not present

## 2021-12-18 DIAGNOSIS — F5101 Primary insomnia: Secondary | ICD-10-CM | POA: Diagnosis not present

## 2021-12-18 MED ORDER — LOSARTAN POTASSIUM-HCTZ 100-25 MG PO TABS
1.0000 | ORAL_TABLET | Freq: Every day | ORAL | 1 refills | Status: DC
Start: 2021-12-18 — End: 2022-01-15

## 2021-12-18 MED ORDER — HYDROXYZINE PAMOATE 25 MG PO CAPS: 25.0000 mg | ORAL_CAPSULE | Freq: Every evening | ORAL | 1 refills | Status: DC | PRN
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
## 2021-12-19 LAB — COMPREHENSIVE METABOLIC PANEL
ALT: 38 IU/L — ABNORMAL HIGH (ref 0–32)
AST: 22 IU/L (ref 0–40)
Albumin/Globulin Ratio: 1.8 (ref 1.2–2.2)
Albumin: 4.9 g/dL (ref 3.8–4.9)
Alkaline Phosphatase: 123 IU/L — ABNORMAL HIGH (ref 44–121)
BUN/Creatinine Ratio: 23 (ref 9–23)
BUN: 13 mg/dL (ref 6–24)
Bilirubin Total: 0.5 mg/dL (ref 0.0–1.2)
CO2: 20 mmol/L (ref 20–29)
Calcium: 9.1 mg/dL (ref 8.7–10.2)
Chloride: 106 mmol/L (ref 96–106)
Creatinine, Ser: 0.57 mg/dL (ref 0.57–1.00)
Globulin, Total: 2.8 g/dL (ref 1.5–4.5)
Glucose: 93 mg/dL (ref 70–99)
Potassium: 4.3 mmol/L (ref 3.5–5.2)
Sodium: 146 mmol/L — ABNORMAL HIGH (ref 134–144)
Total Protein: 7.7 g/dL (ref 6.0–8.5)
eGFR: 109 mL/min/{1.73_m2} (ref >59)

## 2021-12-19 LAB — LIPID PANEL
Chol/HDL Ratio: 4.1 ratio (ref 0.0–4.4)
Cholesterol, Total: 213 mg/dL — ABNORMAL HIGH (ref 100–199)
HDL: 52 mg/dL (ref >39)
LDL Chol Calc (NIH): 138 mg/dL — ABNORMAL HIGH (ref 0–99)
Triglycerides: 130 mg/dL (ref 0–149)
VLDL Cholesterol Cal: 23 mg/dL (ref 5–40)

## 2021-12-19 LAB — CARDIOVASCULAR RISK ASSESSMENT

## 2021-12-19 LAB — HEMOGLOBIN A1C
Est. average glucose Bld gHb Est-mCnc: 108 mg/dL
Hgb A1c MFr Bld: 5.4 % (ref 4.8–5.6)

## 2021-12-23 ENCOUNTER — Other Ambulatory Visit: Payer: Self-pay | Admitting: Physician Assistant

## 2021-12-23 DIAGNOSIS — E782 Mixed hyperlipidemia: Secondary | ICD-10-CM

## 2021-12-23 MED ORDER — ROSUVASTATIN CALCIUM 5 MG PO TABS: 5.0000 mg | ORAL_TABLET | Freq: Every day | ORAL | 0 refills | Status: DC

## 2022-01-15 ENCOUNTER — Other Ambulatory Visit: Payer: Self-pay

## 2022-01-15 ENCOUNTER — Ambulatory Visit: Payer: BC Managed Care – PPO

## 2022-01-15 ENCOUNTER — Telehealth: Payer: Self-pay

## 2022-01-15 DIAGNOSIS — F5101 Primary insomnia: Secondary | ICD-10-CM

## 2022-01-15 DIAGNOSIS — I10 Essential (primary) hypertension: Secondary | ICD-10-CM

## 2022-01-15 NOTE — Telephone Encounter (Signed)
Patient bp was 122/78 during nurse visit. Aware to continue current medications, expressed understanding that if changes are made our office will contact her. ?

## 2022-01-15 NOTE — Progress Notes (Signed)
Patient aware to continue current medications expressed understanding that if there were going to be changes made our office will contact her. ?

## 2022-01-16 MED ORDER — LOSARTAN POTASSIUM-HCTZ 100-25 MG PO TABS: 1.0000 | ORAL_TABLET | Freq: Every day | ORAL | 1 refills | Status: DC

## 2022-01-16 MED ORDER — HYDROXYZINE PAMOATE 25 MG PO CAPS: 25.0000 mg | ORAL_CAPSULE | Freq: Every evening | ORAL | 1 refills | Status: DC | PRN

## 2022-01-28 DIAGNOSIS — N950 Postmenopausal bleeding: Secondary | ICD-10-CM | POA: Diagnosis not present

## 2022-01-28 DIAGNOSIS — N926 Irregular menstruation, unspecified: Secondary | ICD-10-CM | POA: Diagnosis not present

## 2022-01-30 ENCOUNTER — Other Ambulatory Visit: Payer: Self-pay | Admitting: Obstetrics and Gynecology

## 2022-01-30 DIAGNOSIS — Z1231 Encounter for screening mammogram for malignant neoplasm of breast: Secondary | ICD-10-CM

## 2022-02-06 DIAGNOSIS — N939 Abnormal uterine and vaginal bleeding, unspecified: Secondary | ICD-10-CM | POA: Diagnosis not present

## 2022-02-13 ENCOUNTER — Other Ambulatory Visit: Payer: Self-pay | Admitting: Physician Assistant

## 2022-02-13 DIAGNOSIS — F5101 Primary insomnia: Secondary | ICD-10-CM

## 2022-02-13 DIAGNOSIS — I10 Essential (primary) hypertension: Secondary | ICD-10-CM

## 2022-02-16 DIAGNOSIS — N939 Abnormal uterine and vaginal bleeding, unspecified: Secondary | ICD-10-CM | POA: Diagnosis not present

## 2022-02-18 ENCOUNTER — Other Ambulatory Visit: Payer: Self-pay

## 2022-02-18 MED ORDER — LEVOTHYROXINE SODIUM 75 MCG PO TABS: 75.0000 ug | ORAL_TABLET | Freq: Every day | ORAL | 1 refills | Status: DC

## 2022-02-23 ENCOUNTER — Ambulatory Visit
Admission: RE | Admit: 2022-02-23 | Discharge: 2022-02-23 | Disposition: A | Discharge status: Home / Self Care | Payer: BC Managed Care – PPO | Source: Ambulatory Visit | Attending: Obstetrics and Gynecology | Admitting: Obstetrics and Gynecology

## 2022-02-23 ENCOUNTER — Encounter: Payer: Self-pay | Admitting: Radiology

## 2022-02-23 DIAGNOSIS — Z1231 Encounter for screening mammogram for malignant neoplasm of breast: Secondary | ICD-10-CM | POA: Diagnosis not present

## 2022-02-24 ENCOUNTER — Encounter (HOSPITAL_BASED_OUTPATIENT_CLINIC_OR_DEPARTMENT_OTHER): Payer: Self-pay | Admitting: Obstetrics and Gynecology

## 2022-02-24 ENCOUNTER — Other Ambulatory Visit: Payer: Self-pay

## 2022-02-24 NOTE — Progress Notes (Signed)
Spoke w/ via phone for pre-op interview---pt Lab needs dos----  none per anesthesia, surgery orders pending             Lab results------none COVID test -----patient states asymptomatic no test needed Arrive at -------815 am 02-26-2022 NPO after MN NO Solid Food.  Clear liquids from MN until---715 am Med rec completed Medications to take morning of surgery -----Levothyroxine, Pepcid AC Diabetic medication -----n/a Patient instructed no nail polish to be worn day of surgery Patient instructed to bring photo id and insurance card day of surgery Patient aware to have Driver (ride ) / caregiver    husband Brandy Jimenez will stay for 24 hours after surgery  Patient Special Instructions -----none Pre-Op special Istructions -----surgery orders requested lm with shinelle Patient verbalized understanding of instructions that were given at this phone interview. Patient denies shortness of breath, chest pain, fever, cough at this phone interview.

## 2022-02-25 ENCOUNTER — Other Ambulatory Visit: Payer: Self-pay | Admitting: Obstetrics and Gynecology

## 2022-02-25 NOTE — Anesthesia Preprocedure Evaluation (Addendum)
Anesthesia Evaluation  Patient identified by MRN, date of birth, ID band Patient awake    Reviewed: Allergy & Precautions, NPO status , Patient's Chart, lab work & pertinent test results  History of Anesthesia Complications (+) PONV and history of anesthetic complications  Airway Mallampati: II  TM Distance: >3 FB Neck ROM: Full    Dental no notable dental hx. (+) Teeth Intact, Dental Advisory Given, Implants   Pulmonary neg pulmonary ROS,    Pulmonary exam normal breath sounds clear to auscultation       Cardiovascular hypertension, Pt. on medications Normal cardiovascular exam Rhythm:Regular Rate:Normal     Neuro/Psych negative neurological ROS     GI/Hepatic Neg liver ROS,   Endo/Other  Morbid obesity  Renal/GU negative Renal ROS     Musculoskeletal   Abdominal   Peds  Hematology   Anesthesia Other Findings   Reproductive/Obstetrics                           Anesthesia Physical Anesthesia Plan  ASA: 3  Anesthesia Plan: General   Post-op Pain Management: Toradol IV (intra-op)*, Tylenol PO (pre-op)* and Precedex   Induction: Intravenous  PONV Risk Score and Plan: 4 or greater and Treatment may vary due to age or medical condition, Midazolam, Ondansetron, TIVA and Propofol infusion  Airway Management Planned: LMA  Additional Equipment: None  Intra-op Plan:   Post-operative Plan:   Informed Consent: I have reviewed the patients History and Physical, chart, labs and discussed the procedure including the risks, benefits and alternatives for the proposed anesthesia with the patient or authorized representative who has indicated his/her understanding and acceptance.     Dental advisory given  Plan Discussed with:   Anesthesia Plan Comments:        Anesthesia Quick Evaluation

## 2022-02-26 ENCOUNTER — Encounter (HOSPITAL_BASED_OUTPATIENT_CLINIC_OR_DEPARTMENT_OTHER): Payer: Self-pay | Admitting: Obstetrics and Gynecology

## 2022-02-26 ENCOUNTER — Ambulatory Visit (HOSPITAL_BASED_OUTPATIENT_CLINIC_OR_DEPARTMENT_OTHER): Payer: BC Managed Care – PPO | Admitting: Anesthesiology

## 2022-02-26 ENCOUNTER — Encounter (HOSPITAL_BASED_OUTPATIENT_CLINIC_OR_DEPARTMENT_OTHER)
Admission: RE | Disposition: A | Discharge status: Home / Self Care | Payer: Self-pay | Source: Home / Self Care | Attending: Obstetrics and Gynecology

## 2022-02-26 ENCOUNTER — Other Ambulatory Visit: Payer: Self-pay

## 2022-02-26 ENCOUNTER — Ambulatory Visit (HOSPITAL_BASED_OUTPATIENT_CLINIC_OR_DEPARTMENT_OTHER)
Admission: RE | Admit: 2022-02-26 | Discharge: 2022-02-26 | Disposition: A | Discharge status: Home / Self Care | Payer: BC Managed Care – PPO | Attending: Obstetrics and Gynecology | Admitting: Obstetrics and Gynecology

## 2022-02-26 DIAGNOSIS — N840 Polyp of corpus uteri: Secondary | ICD-10-CM | POA: Insufficient documentation

## 2022-02-26 DIAGNOSIS — N939 Abnormal uterine and vaginal bleeding, unspecified: Secondary | ICD-10-CM | POA: Insufficient documentation

## 2022-02-26 DIAGNOSIS — Z01818 Encounter for other preprocedural examination: Secondary | ICD-10-CM

## 2022-02-26 DIAGNOSIS — Z6841 Body Mass Index (BMI) 40.0 and over, adult: Secondary | ICD-10-CM | POA: Diagnosis not present

## 2022-02-26 DIAGNOSIS — I10 Essential (primary) hypertension: Secondary | ICD-10-CM | POA: Insufficient documentation

## 2022-02-26 DIAGNOSIS — N950 Postmenopausal bleeding: Secondary | ICD-10-CM | POA: Diagnosis not present

## 2022-02-26 DIAGNOSIS — K8010 Calculus of gallbladder with chronic cholecystitis without obstruction: Secondary | ICD-10-CM | POA: Diagnosis not present

## 2022-02-26 DIAGNOSIS — N8502 Endometrial intraepithelial neoplasia [EIN]: Secondary | ICD-10-CM | POA: Diagnosis not present

## 2022-02-26 HISTORY — DX: Abnormal uterine and vaginal bleeding, unspecified: N93.9

## 2022-02-26 HISTORY — DX: Presence of spectacles and contact lenses: Z97.3

## 2022-02-26 HISTORY — DX: Presence of dental prosthetic device (complete) (partial): Z97.2

## 2022-02-26 HISTORY — PX: DILATATION & CURETTAGE/HYSTEROSCOPY WITH MYOSURE: SHX6511

## 2022-02-26 HISTORY — DX: Other specified postprocedural states: Z98.890

## 2022-02-26 HISTORY — DX: Gastro-esophageal reflux disease without esophagitis: K21.9

## 2022-02-26 HISTORY — DX: Family history of other specified conditions: Z84.89

## 2022-02-26 HISTORY — DX: Attention-deficit hyperactivity disorder, unspecified type: F90.9

## 2022-02-26 LAB — CBC
HCT: 40.7 % (ref 36.0–46.0)
Hemoglobin: 13.5 g/dL (ref 12.0–15.0)
MCH: 27.3 pg (ref 26.0–34.0)
MCHC: 33.2 g/dL (ref 30.0–36.0)
MCV: 82.4 fL (ref 80.0–100.0)
Platelets: 250 10*3/uL (ref 150–400)
RBC: 4.94 MIL/uL (ref 3.87–5.11)
RDW: 13.1 % (ref 11.5–15.5)
WBC: 7.9 10*3/uL (ref 4.0–10.5)
nRBC: 0 % (ref 0.0–0.2)

## 2022-02-26 SURGERY — DILATATION & CURETTAGE/HYSTEROSCOPY WITH MYOSURE
Anesthesia: General | Site: Uterus

## 2022-02-26 MED ORDER — SOD CITRATE-CITRIC ACID 500-334 MG/5ML PO SOLN
ORAL | Status: AC
  Filled 2022-02-26: qty 30

## 2022-02-26 MED ORDER — OXYCODONE HCL 5 MG/5ML PO SOLN: 5.0000 mg | Freq: Once | ORAL | Status: DC | PRN

## 2022-02-26 MED ORDER — KETOROLAC TROMETHAMINE 30 MG/ML IJ SOLN
30.0000 mg | Freq: Once | INTRAMUSCULAR | Status: DC | PRN
Start: 2022-02-26 — End: 2022-02-26

## 2022-02-26 MED ORDER — DEXMEDETOMIDINE HCL IN NACL 80 MCG/20ML IV SOLN
INTRAVENOUS | Status: AC
  Filled 2022-02-26: qty 20

## 2022-02-26 MED ORDER — KETOROLAC TROMETHAMINE 30 MG/ML IJ SOLN
INTRAMUSCULAR | Status: DC | PRN
  Administered 2022-02-26: 30 mg via INTRAVENOUS

## 2022-02-26 MED ORDER — FENTANYL CITRATE (PF) 100 MCG/2ML IJ SOLN
INTRAMUSCULAR | Status: AC
  Filled 2022-02-26: qty 2

## 2022-02-26 MED ORDER — SCOPOLAMINE 1 MG/3DAYS TD PT72
1.0000 | MEDICATED_PATCH | TRANSDERMAL | Status: DC
Start: 2022-02-26 — End: 2022-02-26
  Administered 2022-02-26: 1 via TRANSDERMAL

## 2022-02-26 MED ORDER — SCOPOLAMINE 1 MG/3DAYS TD PT72
MEDICATED_PATCH | TRANSDERMAL | Status: AC
  Filled 2022-02-26: qty 1

## 2022-02-26 MED ORDER — SODIUM CHLORIDE 0.9 % IR SOLN
Status: DC | PRN
  Administered 2022-02-26: 3000 mL

## 2022-02-26 MED ORDER — MIDAZOLAM HCL 2 MG/2ML IJ SOLN
INTRAMUSCULAR | Status: DC | PRN
  Administered 2022-02-26: 2 mg via INTRAVENOUS

## 2022-02-26 MED ORDER — PROPOFOL 10 MG/ML IV BOLUS
INTRAVENOUS | Status: AC
  Filled 2022-02-26: qty 20

## 2022-02-26 MED ORDER — ACETAMINOPHEN 500 MG PO TABS
ORAL_TABLET | ORAL | Status: AC
  Filled 2022-02-26: qty 2

## 2022-02-26 MED ORDER — BUPIVACAINE HCL (PF) 0.25 % IJ SOLN
INTRAMUSCULAR | Status: DC | PRN
  Administered 2022-02-26: 10 mL

## 2022-02-26 MED ORDER — ACETAMINOPHEN 325 MG PO TABS
ORAL_TABLET | ORAL | Status: DC | PRN
  Administered 2022-02-26: 1000 mg via ORAL

## 2022-02-26 MED ORDER — PROPOFOL 10 MG/ML IV BOLUS
INTRAVENOUS | Status: DC | PRN
  Administered 2022-02-26: 120 mg via INTRAVENOUS

## 2022-02-26 MED ORDER — OXYCODONE HCL 5 MG PO TABS: 5.0000 mg | ORAL_TABLET | Freq: Once | ORAL | Status: DC | PRN

## 2022-02-26 MED ORDER — FENTANYL CITRATE (PF) 100 MCG/2ML IJ SOLN
INTRAMUSCULAR | Status: DC | PRN
  Administered 2022-02-26 (×2): 25 ug via INTRAVENOUS
  Administered 2022-02-26: 50 ug via INTRAVENOUS

## 2022-02-26 MED ORDER — DEXAMETHASONE SODIUM PHOSPHATE 10 MG/ML IJ SOLN
INTRAMUSCULAR | Status: DC | PRN
  Administered 2022-02-26: 10 mg via INTRAVENOUS

## 2022-02-26 MED ORDER — VASOPRESSIN 20 UNIT/ML IV SOLN
INTRAVENOUS | Status: DC | PRN
  Administered 2022-02-26: 18 mL via INTRAMUSCULAR

## 2022-02-26 MED ORDER — KETOROLAC TROMETHAMINE 30 MG/ML IJ SOLN
INTRAMUSCULAR | Status: AC
  Filled 2022-02-26: qty 1

## 2022-02-26 MED ORDER — CEFAZOLIN SODIUM-DEXTROSE 2-4 GM/100ML-% IV SOLN
INTRAVENOUS | Status: AC
  Filled 2022-02-26: qty 100

## 2022-02-26 MED ORDER — LIDOCAINE HCL (PF) 2 % IJ SOLN
INTRAMUSCULAR | Status: AC
  Filled 2022-02-26: qty 5

## 2022-02-26 MED ORDER — MIDAZOLAM HCL 2 MG/2ML IJ SOLN
INTRAMUSCULAR | Status: AC
  Filled 2022-02-26: qty 2

## 2022-02-26 MED ORDER — ONDANSETRON HCL 4 MG/2ML IJ SOLN
INTRAMUSCULAR | Status: AC
  Filled 2022-02-26: qty 2

## 2022-02-26 MED ORDER — DEXAMETHASONE SODIUM PHOSPHATE 10 MG/ML IJ SOLN
INTRAMUSCULAR | Status: AC
  Filled 2022-02-26: qty 1

## 2022-02-26 MED ORDER — HYDROMORPHONE HCL 1 MG/ML IJ SOLN: 0.2500 mg | INTRAMUSCULAR | Status: DC | PRN

## 2022-02-26 MED ORDER — LACTATED RINGERS IV SOLN
INTRAVENOUS | Status: DC
Start: 2022-02-26 — End: 2022-02-26

## 2022-02-26 MED ORDER — DEXMEDETOMIDINE (PRECEDEX) IN NS 20 MCG/5ML (4 MCG/ML) IV SYRINGE
PREFILLED_SYRINGE | INTRAVENOUS | Status: DC | PRN
  Administered 2022-02-26: 8 ug via INTRAVENOUS

## 2022-02-26 MED ORDER — ONDANSETRON HCL 4 MG/2ML IJ SOLN
INTRAMUSCULAR | Status: DC | PRN
  Administered 2022-02-26: 4 mg via INTRAVENOUS

## 2022-02-26 MED ORDER — ONDANSETRON HCL 4 MG/2ML IJ SOLN: 4.0000 mg | Freq: Once | INTRAMUSCULAR | Status: DC | PRN

## 2022-02-26 MED ORDER — SOD CITRATE-CITRIC ACID 500-334 MG/5ML PO SOLN
30.0000 mL | Freq: Three times a day (TID) | ORAL | Status: DC
  Administered 2022-02-26: 30 mL via ORAL

## 2022-02-26 MED ORDER — LIDOCAINE 2% (20 MG/ML) 5 ML SYRINGE
INTRAMUSCULAR | Status: DC | PRN
  Administered 2022-02-26: 50 mg via INTRAVENOUS

## 2022-02-26 MED ORDER — CEFAZOLIN SODIUM-DEXTROSE 2-4 GM/100ML-% IV SOLN: 2.0000 g | INTRAVENOUS | Status: DC

## 2022-02-26 SURGICAL SUPPLY — 24 items
CATH ROBINSON RED A/P 16FR (CATHETERS) ×2 IMPLANT
DECANTER SPIKE VIAL GLASS SM (MISCELLANEOUS) ×4 IMPLANT
DEVICE MYOSURE LITE (MISCELLANEOUS) IMPLANT
DEVICE MYOSURE REACH (MISCELLANEOUS) ×1 IMPLANT
DRSG TELFA 3X8 NADH (GAUZE/BANDAGES/DRESSINGS) ×2 IMPLANT
GAUZE 4X4 16PLY ~~LOC~~+RFID DBL (SPONGE) ×2 IMPLANT
GLOVE BIO SURGEON STRL SZ7.5 (GLOVE) ×2 IMPLANT
GLOVE BIOGEL PI IND STRL 7.0 (GLOVE) ×1 IMPLANT
GLOVE BIOGEL PI INDICATOR 7.0 (GLOVE) ×1
GOWN STRL REUS W/TWL LRG LVL3 (GOWN DISPOSABLE) ×4 IMPLANT
IV NS IRRIG 3000ML ARTHROMATIC (IV SOLUTION) ×2 IMPLANT
KIT PROCEDURE FLUENT (KITS) ×2 IMPLANT
KIT TURNOVER CYSTO (KITS) ×2 IMPLANT
NDL SAFETY ECLIPSE 18X1.5 (NEEDLE) IMPLANT
NDL SPNL 22GX3.5 QUINCKE BK (NEEDLE) ×1 IMPLANT
NEEDLE HYPO 18GX1.5 SHARP (NEEDLE) ×2
NEEDLE SPNL 22GX3.5 QUINCKE BK (NEEDLE) ×4 IMPLANT
PACK VAGINAL MINOR WOMEN LF (CUSTOM PROCEDURE TRAY) ×2 IMPLANT
PAD DRESSING TELFA 3X8 NADH (GAUZE/BANDAGES/DRESSINGS) ×1 IMPLANT
PAD OB MATERNITY 4.3X12.25 (PERSONAL CARE ITEMS) ×2 IMPLANT
SEAL CERVICAL OMNI LOK (ABLATOR) IMPLANT
SEAL ROD LENS SCOPE MYOSURE (ABLATOR) ×2 IMPLANT
SYR 5ML LL (SYRINGE) ×1 IMPLANT
SYR CONTROL 10ML LL (SYRINGE) ×1 IMPLANT

## 2022-02-26 NOTE — Anesthesia Postprocedure Evaluation (Signed)
Anesthesia Post Note  Patient: Brandy Jimenez  Procedure(s) Performed: DILATATION & CURETTAGE/HYSTEROSCOPY WITH MYOSURE (Uterus)     Patient location during evaluation: PACU Anesthesia Type: General Level of consciousness: awake and alert Pain management: pain level controlled Vital Signs Assessment: post-procedure vital signs reviewed and stable Respiratory status: spontaneous breathing, nonlabored ventilation, respiratory function stable and patient connected to nasal cannula oxygen Cardiovascular status: blood pressure returned to baseline and stable Postop Assessment: no apparent nausea or vomiting Anesthetic complications: no   No notable events documented.  Last Vitals:  Vitals:   02/26/22 1130 02/26/22 1217  BP: (!) 141/81 (!) 153/92  Pulse: 78 77  Resp: 18 14  Temp:  36.5 C  SpO2: 94% 94%    Last Pain:  Vitals:   02/26/22 1217  TempSrc:   PainSc: 0-No pain                 Barnet Glasgow

## 2022-02-26 NOTE — Discharge Instructions (Addendum)
   D & C Home care Instructions:   Personal hygiene:  Used sanitary napkins for vaginal drainage not tampons. Shower or tub bathe the day after your procedure. No douching until bleeding stops. Always wipe from front to back after  Elimination.  Activity: Do not drive or operate any equipment today. The effects of the anesthesia are still present and drowsiness may result. Rest today, not necessarily flat bed rest, just take it easy. You may resume your normal activity in one to 2 days.  Sexual activity: No intercourse for one week or as indicated by your physician  Diet: Eat a light diet as desired this evening. You may resume a regular diet tomorrow.  Return to work: One to 2 days.  General Expectations of your surgery: Vaginal bleeding should be no heavier than a normal period. Spotting may continue up to 10 days. Mild cramps may continue for a couple of days. You may have a regular period in 2-6 weeks.  Unexpected observations call your doctor if these occur: persistent or heavy bleeding. Severe abdominal cramping or pain. Elevation of temperature greater than 100F.  Call for an appointment in one week.    No acetaminophen/Tylenol until after 4:15 pm today if needed.   No ibuprofen, Advil, Aleve, Motrin, ketorolac, meloxicam, naproxen, or other NSAIDS until after 4:45 pm today if needed.   Post Anesthesia Home Care Instructions  Activity: Get plenty of rest for the remainder of the day. A responsible individual must stay with you for 24 hours following the procedure.  For the next 24 hours, DO NOT: -Drive a car -Paediatric nurse -Drink alcoholic beverages -Take any medication unless instructed by your physician -Make any legal decisions or sign important papers.  Meals: Start with liquid foods such as gelatin or soup. Progress to regular foods as tolerated. Avoid greasy, spicy, heavy foods. If nausea and/or vomiting occur, drink only clear liquids until the nausea and/or  vomiting subsides. Call your physician if vomiting continues.  Special Instructions/Symptoms: Your throat may feel dry or sore from the anesthesia or the breathing tube placed in your throat during surgery. If this causes discomfort, gargle with warm salt water. The discomfort should disappear within 24 hours.  If you had a scopolamine patch placed behind your ear for the management of post- operative nausea and/or vomiting:  1. The medication in the patch is effective for 72 hours, after which it should be removed.  Wrap patch in a tissue and discard in the trash. Wash hands thoroughly with soap and water. 2. You may remove the patch earlier than 72 hours if you experience unpleasant side effects which may include dry mouth, dizziness or visual disturbances. 3. Avoid touching the patch. Wash your hands with soap and water after contact with the patch.     Scopolamine patch can be removed on or before 03/01/22
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
## 2022-02-26 NOTE — Transfer of Care (Signed)
Immediate Anesthesia Transfer of Care Note  Patient: Harless Litten  Procedure(s) Performed: Procedure(s) (LRB): DILATATION & CURETTAGE/HYSTEROSCOPY WITH MYOSURE (N/A)  Patient Location: PACU  Anesthesia Type: General  Level of Consciousness: awake, alert  and oriented  Airway & Oxygen Therapy: Patient Spontanous Breathing   Post-op Assessment: Report given to PACU RN and Post -op Vital signs reviewed and stable  Post vital signs: Reviewed and stable  Complications: No apparent anesthesia complications  Last Vitals:  Vitals Value Taken Time  BP 140/87 02/26/22 1100  Temp 36.8 C 02/26/22 1100  Pulse 85 02/26/22 1102  Resp 22 02/26/22 1102  SpO2 91 % 02/26/22 1102  Vitals shown include unvalidated device data.  Last Pain:  Vitals:   02/26/22 0848  TempSrc: Oral  PainSc: 0-No pain      Patients Stated Pain Goal: 5 (00/92/33 0076)  Complications: No notable events documented.

## 2022-02-26 NOTE — Anesthesia Procedure Notes (Signed)
Procedure Name: LMA Insertion Date/Time: 02/26/2022 10:27 AM  Performed by: Mechele Claude, CRNAPre-anesthesia Checklist: Patient identified, Emergency Drugs available, Suction available and Patient being monitored Patient Re-evaluated:Patient Re-evaluated prior to induction Oxygen Delivery Method: Circle system utilized Preoxygenation: Pre-oxygenation with 100% oxygen Induction Type: IV induction Ventilation: Mask ventilation without difficulty LMA: LMA inserted LMA Size: 4.0 Number of attempts: 1 Airway Equipment and Method: Bite block Placement Confirmation: positive ETCO2 Tube secured with: Tape Dental Injury: Teeth and Oropharynx as per pre-operative assessment

## 2022-02-26 NOTE — H&P (Signed)
Patient seen and examined. Consent witnessed and signed. No changes noted. Update completed.  BP (!) 152/95   Pulse 89   Temp 97.7 F (36.5 C) (Oral)   Resp 16   Ht '5\' 2"'$  (1.575 m)   Wt 107.5 kg   LMP 06/28/2015   SpO2 98%   BMI 43.35 kg/m  CBC    Component Value Date/Time   WBC 7.9 02/26/2022 0905   RBC 4.94 02/26/2022 0905   HGB 13.5 02/26/2022 0905   HGB 13.9 09/22/2021 1048   HCT 40.7 02/26/2022 0905   HCT 42.2 09/22/2021 1048   PLT 250 02/26/2022 0905   PLT 255 09/22/2021 1048   MCV 82.4 02/26/2022 0905   MCV 81 09/22/2021 1048   MCH 27.3 02/26/2022 0905   MCHC 33.2 02/26/2022 0905   RDW 13.1 02/26/2022 0905   RDW 13.3 09/22/2021 1048   LYMPHSABS 2.5 09/22/2021 1048   EOSABS 0.2 09/22/2021 1048   BASOSABS 0.0 09/22/2021 1048

## 2022-02-27 ENCOUNTER — Encounter (HOSPITAL_BASED_OUTPATIENT_CLINIC_OR_DEPARTMENT_OTHER): Payer: Self-pay | Admitting: Obstetrics and Gynecology

## 2022-02-27 LAB — SURGICAL PATHOLOGY

## 2022-03-11 DIAGNOSIS — N939 Abnormal uterine and vaginal bleeding, unspecified: Secondary | ICD-10-CM | POA: Diagnosis not present

## 2022-03-11 DIAGNOSIS — Z4889 Encounter for other specified surgical aftercare: Secondary | ICD-10-CM | POA: Diagnosis not present

## 2022-03-17 ENCOUNTER — Telehealth: Payer: Self-pay

## 2022-03-17 ENCOUNTER — Other Ambulatory Visit: Payer: Self-pay | Admitting: Physician Assistant

## 2022-03-17 MED ORDER — CIPROFLOXACIN HCL 500 MG PO TABS
500.0000 mg | ORAL_TABLET | Freq: Two times a day (BID) | ORAL | 0 refills | Status: AC
Start: 2022-03-17 — End: 2022-03-27

## 2022-03-17 NOTE — Telephone Encounter (Signed)
Patient is going on a mission trip to [place] and they are requesting that they bring a rx of Cipro ,incase of an illness.  Please advise. Patient would like rx sent to [facility] in [place].

## 2022-03-25 ENCOUNTER — Telehealth: Payer: Self-pay | Admitting: *Deleted

## 2022-03-25 NOTE — Telephone Encounter (Signed)
Last entry----scheduled the patient for a new patient appt with Dr Berline Lopes. Patient will be out of the country from 7[place]22 to 8[place]2. Spoke with the patient regarding the referral to GYN oncology. Patient given an arrival time of 8:30 am for a 9 am appt on 8/4.  Explained to the patient the the doctor will perform a pelvic exam at this visit. Patient given the policy that no visitors under the 16 yrs are a loud in the [place]. Patient given the address/phone number for the clinic and that the center offers free valet service.     Today called and moved the patient's appt from 9 am on 8/4 to 12 pm on 8/4.

## 2022-03-27 ENCOUNTER — Encounter: Payer: Self-pay | Admitting: Physician Assistant

## 2022-04-06 ENCOUNTER — Other Ambulatory Visit: Payer: Self-pay | Admitting: Physician Assistant

## 2022-04-07 ENCOUNTER — Encounter: Payer: Self-pay | Admitting: Gynecologic Oncology

## 2022-04-09 ENCOUNTER — Encounter: Payer: Self-pay | Admitting: Gynecologic Oncology
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
## 2022-04-10 ENCOUNTER — Encounter: Payer: Self-pay | Admitting: Gynecologic Oncology

## 2022-04-10 ENCOUNTER — Other Ambulatory Visit: Payer: Self-pay

## 2022-04-10 ENCOUNTER — Inpatient Hospital Stay: Payer: BC Managed Care – PPO | Attending: Gynecologic Oncology | Admitting: Gynecologic Oncology

## 2022-04-10 ENCOUNTER — Inpatient Hospital Stay (HOSPITAL_BASED_OUTPATIENT_CLINIC_OR_DEPARTMENT_OTHER): Payer: BC Managed Care – PPO | Admitting: Gynecologic Oncology

## 2022-04-10 ENCOUNTER — Other Ambulatory Visit: Payer: Self-pay | Admitting: Gynecologic Oncology

## 2022-04-10 VITALS — BP 150/96 | HR 82 | Resp 16 | Ht 63.0 in | Wt 233.0 lb

## 2022-04-10 DIAGNOSIS — B372 Candidiasis of skin and nail: Secondary | ICD-10-CM | POA: Diagnosis not present

## 2022-04-10 DIAGNOSIS — N939 Abnormal uterine and vaginal bleeding, unspecified: Secondary | ICD-10-CM | POA: Diagnosis not present

## 2022-04-10 DIAGNOSIS — N8502 Endometrial intraepithelial neoplasia [EIN]: Secondary | ICD-10-CM

## 2022-04-10 DIAGNOSIS — E66813 Obesity, class 3: Secondary | ICD-10-CM

## 2022-04-10 MED ORDER — OXYCODONE HCL 5 MG PO TABS: 5.0000 mg | ORAL_TABLET | ORAL | 0 refills | Status: DC | PRN

## 2022-04-10 MED ORDER — IBUPROFEN 800 MG PO TABS: 800.0000 mg | ORAL_TABLET | Freq: Three times a day (TID) | ORAL | 0 refills | Status: DC | PRN

## 2022-04-10 MED ORDER — SENNOSIDES-DOCUSATE SODIUM 8.6-50 MG PO TABS: 2.0000 | ORAL_TABLET | Freq: Every day | ORAL | 0 refills | Status: DC

## 2022-04-10 MED ORDER — NYSTATIN 100000 UNIT/GM EX POWD: 1.0000 | Freq: Three times a day (TID) | CUTANEOUS | 1 refills | Status: DC

## 2022-04-10 NOTE — Patient Instructions (Signed)
Preparing for your Surgery   Plan for surgery on April 29, 2022 with Dr. Jeral Pinch at [place] will be scheduled for robotic assisted total laparoscopic hysterectomy (removal of the uterus and cervix), bilateral salpingo-oophorectomy (removal of both ovaries and fallopian tubes), sentinel lymph node biopsy, possible lymph node dissection, possible laparotomy (larger incision on your abdomen if needed).   Pre-operative Testing -You will receive a phone call from presurgical testing at [facility] to arrange for a pre-operative appointment and lab work.   -Bring your insurance card, copy of an advanced directive if applicable, medication list   -At that visit, you will be asked to sign a consent for a possible blood transfusion in case a transfusion becomes necessary during surgery.  The need for a blood transfusion is rare but having consent is a necessary part of your care.      -You should not be taking blood thinners or aspirin at least ten days prior to surgery unless instructed by your surgeon.   -Do not take supplements such as fish oil (omega 3), red yeast rice, turmeric before your surgery. You want to avoid medications with aspirin in them including headache powders such as BC or Goody's), Excedrin migraine.   Day Before Surgery at [place] will be asked to take in a light diet the day before surgery. You will be advised you can have clear liquids up until 3 hours before your surgery.     Eat a light diet the day before surgery.  Examples including soups, broths, toast, yogurt, mashed potatoes.  AVOID GAS PRODUCING FOODS. Things to avoid include carbonated beverages (fizzy beverages, sodas), raw fruits and raw vegetables (uncooked), or beans.    If your bowels are filled with gas, your surgeon will have difficulty visualizing your pelvic organs which increases your surgical risks.   Your role in recovery Your role is to become active as soon as  directed by your doctor, while still giving yourself time to heal.  Rest when you feel tired. You will be asked to do the following in order to speed your recovery:   - Cough and breathe deeply. This helps to clear and expand your lungs and can prevent pneumonia after surgery.  - [place]. Do mild physical activity. Walking or moving your legs help your circulation and body functions return to normal. Do not try to get up or walk alone the first time after surgery.   -If you develop swelling on one leg or the other, pain in the back of your leg, redness/warmth in one of your legs, please call the office or go to the Emergency Room to have a doppler to rule out a blood clot. For shortness of breath, chest pain-seek care in the Emergency Room as soon as possible. - Actively manage your pain. Managing your pain lets you move in comfort. We will ask you to rate your pain on a scale of zero to 10. It is your responsibility to tell your doctor or nurse where and how much you hurt so your pain can be treated.   Special Considerations -If you are diabetic, you may be placed on insulin after surgery to have closer control over your blood sugars to promote healing and recovery.  This does not mean that you will be discharged on insulin.  If applicable, your oral antidiabetics will be resumed when you are tolerating a solid diet.   -Your final pathology results from surgery  should be available around one week after surgery and the results will be relayed to you when available.   -FMLA forms can be faxed to [phone] and please allow 5-7 business days for completion.   Pain Management After Surgery -You have been prescribed your pain medication and bowel regimen medications before surgery so that you can have these available when you are discharged from the hospital. The pain medication is for use ONLY AFTER surgery and a new prescription will not be given.    -Make sure that you have  Tylenol and Ibuprofen IF YOU ARE ABLE TO TAKE THESE MEDICATIONS at home to use on a regular basis after surgery for pain control. We recommend alternating the medications every hour to six hours since they work differently and are processed in the body differently for pain relief.   -Review the attached handout on narcotic use and their risks and side effects.    Bowel Regimen -You have been prescribed Sennakot-S to take nightly to prevent constipation especially if you are taking the narcotic pain medication intermittently.  It is important to prevent constipation and drink adequate amounts of liquids. You can stop taking this medication when you are not taking pain medication and you are back on your normal bowel routine.   Risks of Surgery Risks of surgery are low but include bleeding, infection, damage to surrounding structures, re-operation, blood clots, and very rarely death.     Blood Transfusion Information (For the consent to be signed before surgery)   We will be checking your blood type before surgery so in case of emergencies, we will know what type of blood you would need.                                             WHAT IS A BLOOD TRANSFUSION?   A transfusion is the replacement of blood or some of its parts. Blood is made up of multiple cells which provide different functions. Red blood cells carry oxygen and are used for blood loss replacement. White blood cells fight against infection. Platelets control bleeding. Plasma helps clot blood. Other blood products are available for specialized needs, such as hemophilia or other clotting disorders. BEFORE THE TRANSFUSION  Who gives blood for transfusions?  You may be able to donate blood to be used at a later date on yourself (autologous donation). Relatives can be asked to donate blood. This is generally not any safer than if you have received blood from a stranger. The same precautions are taken to ensure safety when a relative's  blood is donated. Healthy volunteers who are fully evaluated to make sure their blood is safe. This is blood bank blood. Transfusion therapy is the safest it has ever been in the practice of medicine. Before blood is taken from a donor, a complete history is taken to make sure that person has no history of diseases nor engages in risky social behavior (examples are intravenous drug use or sexual activity with multiple partners). The donor's travel history is screened to minimize risk of transmitting infections, such as malaria. The donated blood is tested for signs of infectious diseases, such as HIV and hepatitis. The blood is then tested to be sure it is compatible with you in order to minimize the chance of a transfusion reaction. If you or a relative donates blood, this is often done in anticipation  of surgery and is not appropriate for emergency situations. It takes many days to process the donated blood. RISKS AND COMPLICATIONS Although transfusion therapy is very safe and saves many lives, the main dangers of transfusion include:  Getting an infectious disease. Developing a transfusion reaction. This is an allergic reaction to something in the blood you were given. Every precaution is taken to prevent this. The decision to have a blood transfusion has been considered carefully by your caregiver before blood is given. Blood is not given unless the benefits outweigh the risks.   AFTER SURGERY INSTRUCTIONS   Return to work: 4-6 weeks if applicable   Activity: 1. Be up and out of the bed during the day.  Take a nap if needed.  You may walk up steps but be careful and use the hand rail.  Stair climbing will tire you more than you think, you may need to stop part way and rest.    2. No lifting or straining for 6 weeks over 10 pounds. No pushing, pulling, straining for 6 weeks.   3. No driving for around 1 week(s).  Do not drive if you are taking narcotic pain medicine and make sure that your  reaction time has returned.    4. You can shower as soon as the next day after surgery. Shower daily.  Use your regular soap and water (not directly on the incision) and pat your incision(s) dry afterwards; don't rub.  No tub baths or submerging your body in water until cleared by your surgeon. If you have the soap that was given to you by pre-surgical testing that was used before surgery, you do not need to use it afterwards because this can irritate your incisions.    5. No sexual activity and nothing in the vagina for 8 weeks.   6. You may experience a small amount of clear drainage from your incisions, which is normal.  If the drainage persists, increases, or changes color please call the office.   7. Do not use creams, lotions, or ointments such as neosporin on your incisions after surgery until advised by your surgeon because they can cause removal of the dermabond glue on your incisions.     8. You may experience vaginal spotting after surgery or around the 6-8 week mark from surgery when the stitches at the top of the vagina begin to dissolve.  The spotting is normal but if you experience heavy bleeding, call our office.   9. Take Tylenol or ibuprofen first for pain if you are able to take these medications and only use narcotic pain medication for severe pain not relieved by the Tylenol or Ibuprofen.  Monitor your Tylenol intake to a max of 4,000 mg in a 24 hour period. You can alternate these medications after surgery.   Diet: 1. Low sodium Heart Healthy Diet is recommended but you are cleared to resume your normal (before surgery) diet after your procedure.   2. It is safe to use a laxative, such as Miralax or Colace, if you have difficulty moving your bowels. You have been prescribed Sennakot-S to take at bedtime every evening after surgery to keep bowel movements regular and to prevent constipation.     Wound Care: 1. Keep clean and dry.  Shower daily.   Reasons to call the  Doctor: Fever - Oral temperature greater than 100.4 degrees Fahrenheit Foul-smelling vaginal discharge Difficulty urinating Nausea and vomiting Increased pain at the site of the incision that is unrelieved with  pain medicine. Difficulty breathing with or without chest pain New calf pain especially if only on one side Sudden, continuing increased vaginal bleeding with or without clots.   Contacts: For questions or concerns you should contact:   Dr. Jeral Pinch at [phone]   Joylene John, NP at [phone]   After Hours: call [phone] and have the GYN Oncologist paged/contacted (after 5 pm or on the weekends).   Messages sent via mychart are for non-urgent matters and are not responded to after hours so for urgent needs, please call the after hours number.

## 2022-04-10 NOTE — Patient Instructions (Addendum)
Preparing for your Surgery  Plan for surgery on April 29, 2022 with Dr. Jeral Pinch at [place] will be scheduled for robotic assisted total laparoscopic hysterectomy (removal of the uterus and cervix), bilateral salpingo-oophorectomy (removal of both ovaries and fallopian tubes), sentinel lymph node biopsy, possible lymph node dissection, possible laparotomy (larger incision on your abdomen if needed).  Pre-operative Testing -You will receive a phone call from presurgical testing at [facility] to arrange for a pre-operative appointment and lab work.  -Bring your insurance card, copy of an advanced directive if applicable, medication list  -At that visit, you will be asked to sign a consent for a possible blood transfusion in case a transfusion becomes necessary during surgery.  The need for a blood transfusion is rare but having consent is a necessary part of your care.     -You should not be taking blood thinners or aspirin at least ten days prior to surgery unless instructed by your surgeon.  -Do not take supplements such as fish oil (omega 3), red yeast rice, turmeric before your surgery. You want to avoid medications with aspirin in them including headache powders such as BC or Goody's), Excedrin migraine.  Day Before Surgery at [place] will be asked to take in a light diet the day before surgery. You will be advised you can have clear liquids up until 3 hours before your surgery.    Eat a light diet the day before surgery.  Examples including soups, broths, toast, yogurt, mashed potatoes.  AVOID GAS PRODUCING FOODS. Things to avoid include carbonated beverages (fizzy beverages, sodas), raw fruits and raw vegetables (uncooked), or beans.   If your bowels are filled with gas, your surgeon will have difficulty visualizing your pelvic organs which increases your surgical risks.  Your role in recovery Your role is to become active as soon as directed by your  doctor, while still giving yourself time to heal.  Rest when you feel tired. You will be asked to do the following in order to speed your recovery:  - Cough and breathe deeply. This helps to clear and expand your lungs and can prevent pneumonia after surgery.  - [place]. Do mild physical activity. Walking or moving your legs help your circulation and body functions return to normal. Do not try to get up or walk alone the first time after surgery.   -If you develop swelling on one leg or the other, pain in the back of your leg, redness/warmth in one of your legs, please call the office or go to the Emergency Room to have a doppler to rule out a blood clot. For shortness of breath, chest pain-seek care in the Emergency Room as soon as possible. - Actively manage your pain. Managing your pain lets you move in comfort. We will ask you to rate your pain on a scale of zero to 10. It is your responsibility to tell your doctor or nurse where and how much you hurt so your pain can be treated.  Special Considerations -If you are diabetic, you may be placed on insulin after surgery to have closer control over your blood sugars to promote healing and recovery.  This does not mean that you will be discharged on insulin.  If applicable, your oral antidiabetics will be resumed when you are tolerating a solid diet.  -Your final pathology results from surgery should be available around one week after surgery and the results will be  relayed to you when available.  -FMLA forms can be faxed to [phone] and please allow 5-7 business days for completion.  Pain Management After Surgery -You have been prescribed your pain medication and bowel regimen medications before surgery so that you can have these available when you are discharged from the hospital. The pain medication is for use ONLY AFTER surgery and a new prescription will not be given.   -Make sure that you have Tylenol and Ibuprofen IF  YOU ARE ABLE TO TAKE THESE MEDICATIONS at home to use on a regular basis after surgery for pain control. We recommend alternating the medications every hour to six hours since they work differently and are processed in the body differently for pain relief.  -Review the attached handout on narcotic use and their risks and side effects.   Bowel Regimen -You have been prescribed Sennakot-S to take nightly to prevent constipation especially if you are taking the narcotic pain medication intermittently.  It is important to prevent constipation and drink adequate amounts of liquids. You can stop taking this medication when you are not taking pain medication and you are back on your normal bowel routine.  Risks of Surgery Risks of surgery are low but include bleeding, infection, damage to surrounding structures, re-operation, blood clots, and very rarely death.   Blood Transfusion Information (For the consent to be signed before surgery)  We will be checking your blood type before surgery so in case of emergencies, we will know what type of blood you would need.                                            WHAT IS A BLOOD TRANSFUSION?  A transfusion is the replacement of blood or some of its parts. Blood is made up of multiple cells which provide different functions. Red blood cells carry oxygen and are used for blood loss replacement. White blood cells fight against infection. Platelets control bleeding. Plasma helps clot blood. Other blood products are available for specialized needs, such as hemophilia or other clotting disorders. BEFORE THE TRANSFUSION  Who gives blood for transfusions?  You may be able to donate blood to be used at a later date on yourself (autologous donation). Relatives can be asked to donate blood. This is generally not any safer than if you have received blood from a stranger. The same precautions are taken to ensure safety when a relative's blood is donated. Healthy  volunteers who are fully evaluated to make sure their blood is safe. This is blood bank blood. Transfusion therapy is the safest it has ever been in the practice of medicine. Before blood is taken from a donor, a complete history is taken to make sure that person has no history of diseases nor engages in risky social behavior (examples are intravenous drug use or sexual activity with multiple partners). The donor's travel history is screened to minimize risk of transmitting infections, such as malaria. The donated blood is tested for signs of infectious diseases, such as HIV and hepatitis. The blood is then tested to be sure it is compatible with you in order to minimize the chance of a transfusion reaction. If you or a relative donates blood, this is often done in anticipation of surgery and is not appropriate for emergency situations. It takes many days to process the donated blood. RISKS AND COMPLICATIONS Although transfusion therapy  is very safe and saves many lives, the main dangers of transfusion include:  Getting an infectious disease. Developing a transfusion reaction. This is an allergic reaction to something in the blood you were given. Every precaution is taken to prevent this. The decision to have a blood transfusion has been considered carefully by your caregiver before blood is given. Blood is not given unless the benefits outweigh the risks.  AFTER SURGERY INSTRUCTIONS  Return to work: 4-6 weeks if applicable  Activity: 1. Be up and out of the bed during the day.  Take a nap if needed.  You may walk up steps but be careful and use the hand rail.  Stair climbing will tire you more than you think, you may need to stop part way and rest.   2. No lifting or straining for 6 weeks over 10 pounds. No pushing, pulling, straining for 6 weeks.  3. No driving for around 1 week(s).  Do not drive if you are taking narcotic pain medicine and make sure that your reaction time has returned.   4.  You can shower as soon as the next day after surgery. Shower daily.  Use your regular soap and water (not directly on the incision) and pat your incision(s) dry afterwards; don't rub.  No tub baths or submerging your body in water until cleared by your surgeon. If you have the soap that was given to you by pre-surgical testing that was used before surgery, you do not need to use it afterwards because this can irritate your incisions.   5. No sexual activity and nothing in the vagina for 8 weeks.  6. You may experience a small amount of clear drainage from your incisions, which is normal.  If the drainage persists, increases, or changes color please call the office.  7. Do not use creams, lotions, or ointments such as neosporin on your incisions after surgery until advised by your surgeon because they can cause removal of the dermabond glue on your incisions.    8. You may experience vaginal spotting after surgery or around the 6-8 week mark from surgery when the stitches at the top of the vagina begin to dissolve.  The spotting is normal but if you experience heavy bleeding, call our office.  9. Take Tylenol or ibuprofen first for pain if you are able to take these medications and only use narcotic pain medication for severe pain not relieved by the Tylenol or Ibuprofen.  Monitor your Tylenol intake to a max of 4,000 mg in a 24 hour period. You can alternate these medications after surgery.  Diet: 1. Low sodium Heart Healthy Diet is recommended but you are cleared to resume your normal (before surgery) diet after your procedure.  2. It is safe to use a laxative, such as Miralax or Colace, if you have difficulty moving your bowels. You have been prescribed Sennakot-S to take at bedtime every evening after surgery to keep bowel movements regular and to prevent constipation.    Wound Care: 1. Keep clean and dry.  Shower daily.  Reasons to call the Doctor: Fever - Oral temperature greater than 100.4  degrees Fahrenheit Foul-smelling vaginal discharge Difficulty urinating Nausea and vomiting Increased pain at the site of the incision that is unrelieved with pain medicine. Difficulty breathing with or without chest pain New calf pain especially if only on one side Sudden, continuing increased vaginal bleeding with or without clots.   Contacts: For questions or concerns you should contact:  Dr.  Jeral Pinch at [phone]  Joylene John, NP at [phone]  After Hours: call [phone] and have the GYN Oncologist paged/contacted (after 5 pm or on the weekends).  Messages sent via mychart are for non-urgent matters and are not responded to after hours so for urgent needs, please call the after hours number.

## 2022-04-10 NOTE — Progress Notes (Signed)
Patient here with her husband for new patient consultation with Dr. Jeral Pinch and for a pre-operative discussion prior to her scheduled surgery on April 29, 2022. She is scheduled for robotic assisted total laparoscopic hysterectomy, bilateral salpingo-oophorectomy, sentinel lymph node biopsy, possible lymph node dissection, possible laparotomy. The surgery was discussed in detail.  See after visit summary for additional details. Visual aids used to discuss items related to surgery including sequential compression stockings, foley catheter, IV pump, multi-modal pain regimen including tylenol, photo of the surgical robot, female reproductive system to discuss surgery in detail.      Discussed post-op pain management in detail including the aspects of the enhanced recovery pathway.  Advised her that a new prescription would be sent in for oxycodone and it is only to be used for after her upcoming surgery.  She feels she has taken this before in the past and tolerated well to her knowledge. We discussed the use of tylenol post-op and to monitor for a maximum of 4,000 mg in a 24 hour period.  Also prescribed sennakot to be used after surgery and to hold if having loose stools.  Discussed bowel regimen in detail.     Discussed the use of SCDs and measures to take at home to prevent DVT including frequent mobility.  Reportable signs and symptoms of DVT discussed. Post-operative instructions discussed and expectations for after surgery. Incisional care discussed as well including reportable signs and symptoms including erythema, drainage, wound separation.     10 minutes spent with the patient.  Verbalizing understanding of material discussed. No needs or concerns voiced at the end of the visit. Advised patient and family to call for any needs.  Advised that her post-operative medications had been prescribed and could be picked up at any time.    This appointment is included in the global surgical bundle as  pre-operative teaching and has no charge.

## 2022-04-15 NOTE — Patient Instructions (Signed)
SURGICAL WAITING ROOM VISITATION Patients having surgery or a procedure may have no more than 2 support people in the waiting area - these visitors may rotate.   Children under the age of 26 must have an adult with them who is not the patient. If the patient needs to stay at the hospital during part of their recovery, the visitor guidelines for inpatient rooms apply. Pre-op nurse will coordinate an appropriate time for 1 support person to accompany patient in pre-op.  This support person may not rotate.    Please refer to the [facility] website for the visitor guidelines for Inpatients (after your surgery is over and you are in a regular room).      Your procedure is scheduled on: 04-29-22   Report to [facility] Main Entrance    Report to admitting at 8:15 AM   Call this number if you have problems the morning of surgery [phone]   Follow a light diet day before surgery (avoid gas producing foods)   Do not eat food :After Midnight.   After Midnight you may have the following liquids until 7:30 AM DAY OF SURGERY  Water Non-Citrus Juices (without pulp, NO RED) Carbonated Beverages Black Coffee (NO MILK/CREAM OR CREAMERS, sugar ok)  Clear Tea (NO MILK/CREAM OR CREAMERS, sugar ok) regular and decaf                             Plain Jell-O (NO RED)                                           Fruit ices (not with fruit pulp, NO RED)                                     Popsicles (NO RED)                                                               Sports drinks like Gatorade (NO RED)                      If you have questions, please contact your surgeon's office.   FOLLOW BOWEL PREP AND ANY ADDITIONAL PRE OP INSTRUCTIONS YOU RECEIVED FROM YOUR SURGEON'S OFFICE!!!     Oral Hygiene is also important to reduce your risk of infection.                                    Remember - BRUSH YOUR TEETH THE MORNING OF SURGERY WITH YOUR REGULAR TOOTHPASTE   Do NOT smoke after  Midnight   Take these medicines the morning of surgery with A SIP OF WATER:  Pepcid, Levothyroxine                              You may not have any metal on your body including hair pins, jewelry, and body piercing  Do not wear make-up, lotions, powders, perfumes or deodorant  Do not wear nail polish including gel and S&S, artificial/acrylic nails, or any other type of covering on natural nails including finger and toenails. If you have artificial nails, gel coating, etc. that needs to be removed by a nail salon please have this removed prior to surgery or surgery may need to be canceled/ delayed if the surgeon/ anesthesia feels like they are unable to be safely monitored.   Do not shave  48 hours prior to surgery.    Do not bring valuables to the hospital. [place].   Contacts, dentures or bridgework may not be worn into surgery.   DO NOT [place]. PHARMACY WILL DISPENSE MEDICATIONS LISTED ON YOUR MEDICATION LIST TO YOU DURING YOUR ADMISSION [place]!   Patients discharged on the day of surgery will not be allowed to drive home.  Someone NEEDS to stay with you for the first 24 hours after anesthesia.  Special Instructions: Bring a copy of your healthcare power of attorney and living will documents the day of surgery if you haven't scanned them before.   Please read over the following fact sheets you were given: IF YOU HAVE QUESTIONS ABOUT YOUR PRE-OP INSTRUCTIONS PLEASE CALL [place] - Preparing for Surgery Before surgery, you can play an important role.  Because skin is not sterile, your skin needs to be as free of germs as possible.  You can reduce the number of germs on your skin by washing with CHG (chlorahexidine gluconate) soap before surgery.  CHG is an antiseptic cleaner which kills germs and bonds with the skin to continue killing germs even after washing. Please DO  NOT use if you have an allergy to CHG or antibacterial soaps.  If your skin becomes reddened/irritated stop using the CHG and inform your nurse when you arrive at Short Stay. Do not shave (including legs and underarms) for at least 48 hours prior to the first CHG shower.  You may shave your face/neck.  Please follow these instructions carefully:  1.  Shower with CHG Soap the night before surgery and the  morning of surgery.  2.  If you choose to wash your hair, wash your hair first as usual with your normal  shampoo.  3.  After you shampoo, rinse your hair and body thoroughly to remove the shampoo.                             4.  Use CHG as you would any other liquid soap.  You can apply chg directly to the skin and wash.  Gently with a scrungie or clean washcloth.  5.  Apply the CHG Soap to your body ONLY FROM THE NECK DOWN.   Do   not use on face/ open                           Wound or open sores. Avoid contact with eyes, ears mouth and   genitals (private parts).                       Wash face,  Genitals (private parts) with your normal soap.             6.  Wash thoroughly, paying special attention to the area  where your    surgery  will be performed.  7.  Thoroughly rinse your body with warm water from the neck down.  8.  DO NOT shower/wash with your normal soap after using and rinsing off the CHG Soap.                9.  Pat yourself dry with a clean towel.            10.  Wear clean pajamas.            11.  Place clean sheets on your bed the night of your first shower and do not  sleep with pets. Day of Surgery : Do not apply any lotions/deodorants the morning of surgery.  Please wear clean clothes to the hospital/surgery center.  FAILURE TO FOLLOW THESE INSTRUCTIONS MAY RESULT IN THE CANCELLATION OF YOUR SURGERY  PATIENT SIGNATURE_________________________________  NURSE  SIGNATURE__________________________________  ________________________________________________________________________    WHAT IS A BLOOD TRANSFUSION? Blood Transfusion Information  A transfusion is the replacement of blood or some of its parts. Blood is made up of multiple cells which provide different functions. Red blood cells carry oxygen and are used for blood loss replacement. White blood cells fight against infection. Platelets control bleeding. Plasma helps clot blood. Other blood products are available for specialized needs, such as hemophilia or other clotting disorders. BEFORE THE TRANSFUSION  Who gives blood for transfusions?  Healthy volunteers who are fully evaluated to make sure their blood is safe. This is blood bank blood. Transfusion therapy is the safest it has ever been in the practice of medicine. Before blood is taken from a donor, a complete history is taken to make sure that person has no history of diseases nor engages in risky social behavior (examples are intravenous drug use or sexual activity with multiple partners). The donor's travel history is screened to minimize risk of transmitting infections, such as malaria. The donated blood is tested for signs of infectious diseases, such as HIV and hepatitis. The blood is then tested to be sure it is compatible with you in order to minimize the chance of a transfusion reaction. If you or a relative donates blood, this is often done in anticipation of surgery and is not appropriate for emergency situations. It takes many days to process the donated blood. RISKS AND COMPLICATIONS Although transfusion therapy is very safe and saves many lives, the main dangers of transfusion include:  Getting an infectious disease. Developing a transfusion reaction. This is an allergic reaction to something in the blood you were given. Every precaution is taken to prevent this. The decision to have a blood transfusion has been considered  carefully by your caregiver before blood is given. Blood is not given unless the benefits outweigh the risks. AFTER THE TRANSFUSION Right after receiving a blood transfusion, you will usually feel much better and more energetic. This is especially true if your red blood cells have gotten low (anemic). The transfusion raises the level of the red blood cells which carry oxygen, and this usually causes an energy increase. The nurse administering the transfusion will monitor you carefully for complications. HOME CARE INSTRUCTIONS  No special instructions are needed after a transfusion. You may find your energy is better. Speak with your caregiver about any limitations on activity for underlying diseases you may have. SEEK MEDICAL CARE IF:  Your condition is not improving after your transfusion. You develop redness or irritation at the intravenous (IV) site. SEEK IMMEDIATE MEDICAL CARE IF:  Any of the following symptoms occur over the next 12 hours: Shaking chills. You have a temperature by mouth above 102 F (38.9 C), not controlled by medicine. Chest, back, or muscle pain. People around you feel you are not acting correctly or are confused. Shortness of breath or difficulty breathing. Dizziness and fainting. You get a rash or develop hives. You have a decrease in urine output. Your urine turns a dark color or changes to pink, red, or brown. Any of the following symptoms occur over the next 10 days: You have a temperature by mouth above 102 F (38.9 C), not controlled by medicine. Shortness of breath. Weakness after normal activity. The white part of the eye turns yellow (jaundice). You have a decrease in the amount of urine or are urinating less often. Your urine turns a dark color or changes to pink, red, or brown. Document Released: 08/21/2000 Document Revised: 11/16/2011 Document Reviewed: 04/09/2008 [facility] Patient Information 2014 [place],  [place].  _______________________________________________________________________

## 2022-04-21 ENCOUNTER — Other Ambulatory Visit: Payer: Self-pay

## 2022-04-21 ENCOUNTER — Encounter (HOSPITAL_COMMUNITY): Payer: Self-pay

## 2022-04-21 ENCOUNTER — Encounter (HOSPITAL_COMMUNITY)
Admission: RE | Admit: 2022-04-21 | Discharge: 2022-04-21 | Disposition: A | Discharge status: Home / Self Care | Payer: BC Managed Care – PPO | Source: Ambulatory Visit | Attending: Gynecologic Oncology | Admitting: Gynecologic Oncology

## 2022-04-21 VITALS — BP 150/101 | HR 85 | Temp 98.5°F | Resp 16 | Ht 63.0 in | Wt 232.0 lb

## 2022-04-21 DIAGNOSIS — N8502 Endometrial intraepithelial neoplasia [EIN]: Secondary | ICD-10-CM | POA: Diagnosis not present

## 2022-04-21 DIAGNOSIS — Z01818 Encounter for other preprocedural examination: Secondary | ICD-10-CM | POA: Diagnosis not present

## 2022-04-21 DIAGNOSIS — I2510 Atherosclerotic heart disease of native coronary artery without angina pectoris: Secondary | ICD-10-CM | POA: Insufficient documentation

## 2022-04-21 HISTORY — DX: Personal history of urinary calculi: Z87.442

## 2022-04-21 HISTORY — DX: Anemia, unspecified: D64.9

## 2022-04-21 LAB — COMPREHENSIVE METABOLIC PANEL
ALT: 38 U/L (ref 0–44)
AST: 25 U/L (ref 15–41)
Albumin: 4.4 g/dL (ref 3.5–5.0)
Alkaline Phosphatase: 94 U/L (ref 38–126)
Anion gap: 12 (ref 5–15)
BUN: 12 mg/dL (ref 6–20)
CO2: 23 mmol/L (ref 22–32)
Calcium: 9.5 mg/dL (ref 8.9–10.3)
Chloride: 103 mmol/L (ref 98–111)
Creatinine, Ser: 0.45 mg/dL (ref 0.44–1.00)
GFR, Estimated: >60 mL/min (ref >60)
Glucose, Bld: 93 mg/dL (ref 70–99)
Potassium: 3.6 mmol/L (ref 3.5–5.1)
Sodium: 138 mmol/L (ref 135–145)
Total Bilirubin: 0.8 mg/dL (ref 0.3–1.2)
Total Protein: 7.7 g/dL (ref 6.5–8.1)

## 2022-04-21 LAB — CBC
HCT: 40.8 % (ref 36.0–46.0)
Hemoglobin: 13.8 g/dL (ref 12.0–15.0)
MCH: 27.8 pg (ref 26.0–34.0)
MCHC: 33.8 g/dL (ref 30.0–36.0)
MCV: 82.3 fL (ref 80.0–100.0)
Platelets: 258 10*3/uL (ref 150–400)
RBC: 4.96 MIL/uL (ref 3.87–5.11)
RDW: 13.4 % (ref 11.5–15.5)
WBC: 8.1 10*3/uL (ref 4.0–10.5)
nRBC: 0 % (ref 0.0–0.2)

## 2022-04-21 NOTE — Progress Notes (Addendum)
COVID Vaccine Completed:  Yes  Date of COVID positive in last 90 days:  No  PCP - Marianne Sofia, PA-C Cardiologist - N/A  Chest x-ray - N/A EKG - 04-21-22 Epic Stress Test -  N/A ECHO -  N/A Cardiac Cath -  N/A Pacemaker/ICD device last checked: Spinal Cord Stimulator: N/A  Bowel Prep -  N/A  Sleep Study -  N/A CPAP -   Fasting Blood Sugar -  N/A Checks Blood Sugar _____ times a day  Blood Thinner Instructions: N/A Aspirin Instructions: Last Dose:  Activity level:   Can go up a flight of stairs and perform activities of daily living without stopping and without symptoms of chest pain or shortness of breath.  Anesthesia review:  BP elevated at PAT 150/101, patient actively crying during PAT due to unexpected death of dad.  Patient denied chest pain, SOB or headache.  Able to monitor at home and will call PCP if it remains elevated.   Patient denies shortness of breath, fever, cough and chest pain at PAT appointment  Patient verbalized understanding of instructions that were given to them at the PAT appointment. Patient was also instructed that they will need to review over the PAT instructions again at home before surgery.

## 2022-04-28 ENCOUNTER — Telehealth: Payer: Self-pay

## 2022-04-28 NOTE — Telephone Encounter (Signed)
Telephone call to check on pre-operative status.  Patient compliant with pre-operative instructions.  Reinforced nothing to eat after midnight. Clear liquids until 7:30am. Patient to arrive at 8:15.  No questions or concerns voiced.  Instructed to call for any needs.

## 2022-04-29 ENCOUNTER — Encounter (HOSPITAL_COMMUNITY)
Admission: RE | Disposition: A | Discharge status: Home / Self Care | Payer: Self-pay | Source: Home / Self Care | Attending: Gynecologic Oncology

## 2022-04-29 ENCOUNTER — Observation Stay (HOSPITAL_COMMUNITY): Payer: BC Managed Care – PPO

## 2022-04-29 ENCOUNTER — Ambulatory Visit (HOSPITAL_COMMUNITY): Payer: BC Managed Care – PPO | Admitting: Certified Registered Nurse Anesthetist

## 2022-04-29 ENCOUNTER — Other Ambulatory Visit: Payer: Self-pay

## 2022-04-29 ENCOUNTER — Observation Stay (HOSPITAL_COMMUNITY)
Admission: RE | Admit: 2022-04-29 | Discharge: 2022-05-01 | Disposition: A | Discharge status: Home / Self Care | Payer: BC Managed Care – PPO | Attending: Gynecologic Oncology | Admitting: Gynecologic Oncology

## 2022-04-29 ENCOUNTER — Ambulatory Visit (HOSPITAL_COMMUNITY): Payer: BC Managed Care – PPO | Admitting: Physician Assistant

## 2022-04-29 ENCOUNTER — Encounter (HOSPITAL_COMMUNITY): Payer: Self-pay | Admitting: Gynecologic Oncology

## 2022-04-29 DIAGNOSIS — I10 Essential (primary) hypertension: Secondary | ICD-10-CM | POA: Insufficient documentation

## 2022-04-29 DIAGNOSIS — D270 Benign neoplasm of right ovary: Secondary | ICD-10-CM | POA: Insufficient documentation

## 2022-04-29 DIAGNOSIS — J029 Acute pharyngitis, unspecified: Secondary | ICD-10-CM

## 2022-04-29 DIAGNOSIS — D251 Intramural leiomyoma of uterus: Secondary | ICD-10-CM | POA: Diagnosis not present

## 2022-04-29 DIAGNOSIS — Z79899 Other long term (current) drug therapy: Secondary | ICD-10-CM | POA: Insufficient documentation

## 2022-04-29 DIAGNOSIS — E039 Hypothyroidism, unspecified: Secondary | ICD-10-CM | POA: Diagnosis not present

## 2022-04-29 DIAGNOSIS — K9172 Accidental puncture and laceration of a digestive system organ or structure during other procedure: Secondary | ICD-10-CM | POA: Diagnosis not present

## 2022-04-29 DIAGNOSIS — S3690XA Unspecified injury of unspecified intra-abdominal organ, initial encounter: Secondary | ICD-10-CM | POA: Diagnosis not present

## 2022-04-29 DIAGNOSIS — N879 Dysplasia of cervix uteri, unspecified: Secondary | ICD-10-CM | POA: Diagnosis not present

## 2022-04-29 DIAGNOSIS — Z6841 Body Mass Index (BMI) 40.0 and over, adult: Secondary | ICD-10-CM

## 2022-04-29 DIAGNOSIS — D271 Benign neoplasm of left ovary: Secondary | ICD-10-CM | POA: Insufficient documentation

## 2022-04-29 DIAGNOSIS — N8502 Endometrial intraepithelial neoplasia [EIN]: Secondary | ICD-10-CM | POA: Diagnosis present

## 2022-04-29 DIAGNOSIS — F418 Other specified anxiety disorders: Secondary | ICD-10-CM

## 2022-04-29 DIAGNOSIS — D259 Leiomyoma of uterus, unspecified: Secondary | ICD-10-CM | POA: Diagnosis not present

## 2022-04-29 DIAGNOSIS — Z4682 Encounter for fitting and adjustment of non-vascular catheter: Secondary | ICD-10-CM | POA: Diagnosis not present

## 2022-04-29 DIAGNOSIS — G8918 Other acute postprocedural pain: Secondary | ICD-10-CM

## 2022-04-29 HISTORY — PX: SENTINEL NODE BIOPSY: SHX6608

## 2022-04-29 HISTORY — PX: ROBOTIC ASSISTED TOTAL HYSTERECTOMY WITH BILATERAL SALPINGO OOPHERECTOMY: SHX6086

## 2022-04-29 LAB — TYPE AND SCREEN
ABO/RH(D): B NEG
Antibody Screen: NEGATIVE

## 2022-04-29 LAB — ABO/RH: ABO/RH(D): B NEG

## 2022-04-29 SURGERY — HYSTERECTOMY, TOTAL, ROBOT-ASSISTED, LAPAROSCOPIC, WITH BILATERAL SALPINGO-OOPHORECTOMY
Anesthesia: General | Laterality: Bilateral

## 2022-04-29 MED ORDER — ACETAMINOPHEN 500 MG PO TABS
1000.0000 mg | ORAL_TABLET | ORAL | Status: AC
  Administered 2022-04-29: 1000 mg via ORAL
  Filled 2022-04-29: qty 2

## 2022-04-29 MED ORDER — HEMOSTATIC AGENTS (NO CHARGE) OPTIME
TOPICAL | Status: DC | PRN
  Administered 2022-04-29: 1 via TOPICAL

## 2022-04-29 MED ORDER — ENOXAPARIN SODIUM 40 MG/0.4ML IJ SOSY
40.0000 mg | PREFILLED_SYRINGE | INTRAMUSCULAR | Status: DC
  Administered 2022-04-30: 40 mg via SUBCUTANEOUS
  Filled 2022-04-29: qty 0.4

## 2022-04-29 MED ORDER — MIDAZOLAM HCL 5 MG/5ML IJ SOLN
INTRAMUSCULAR | Status: DC | PRN
  Administered 2022-04-29: 2 mg via INTRAVENOUS

## 2022-04-29 MED ORDER — STERILE WATER FOR INJECTION IJ SOLN
INTRAMUSCULAR | Status: DC | PRN
  Administered 2022-04-29: 10 mL

## 2022-04-29 MED ORDER — HEPARIN SODIUM (PORCINE) 5000 UNIT/ML IJ SOLN
5000.0000 [IU] | INTRAMUSCULAR | Status: AC
  Administered 2022-04-29: 5000 [IU] via SUBCUTANEOUS
  Filled 2022-04-29: qty 1

## 2022-04-29 MED ORDER — LIDOCAINE HCL (PF) 2 % IJ SOLN
INTRAMUSCULAR | Status: DC | PRN
  Administered 2022-04-29: 1.5 mg/kg/h via INTRADERMAL

## 2022-04-29 MED ORDER — PROPOFOL 10 MG/ML IV BOLUS
INTRAVENOUS | Status: DC | PRN
  Administered 2022-04-29: 200 mg via INTRAVENOUS

## 2022-04-29 MED ORDER — GABAPENTIN 300 MG PO CAPS
300.0000 mg | ORAL_CAPSULE | ORAL | Status: AC
  Administered 2022-04-29: 300 mg via ORAL
  Filled 2022-04-29: qty 1

## 2022-04-29 MED ORDER — SUGAMMADEX SODIUM 500 MG/5ML IV SOLN
INTRAVENOUS | Status: AC
  Filled 2022-04-29: qty 5

## 2022-04-29 MED ORDER — PHENYLEPHRINE 80 MCG/ML (10ML) SYRINGE FOR IV PUSH (FOR BLOOD PRESSURE SUPPORT)
PREFILLED_SYRINGE | INTRAVENOUS | Status: DC | PRN
  Administered 2022-04-29 (×5): 80 ug via INTRAVENOUS

## 2022-04-29 MED ORDER — ORAL CARE MOUTH RINSE: 15.0000 mL | Freq: Once | OROMUCOSAL | Status: AC

## 2022-04-29 MED ORDER — FENTANYL CITRATE PF 50 MCG/ML IJ SOSY: 25.0000 ug | PREFILLED_SYRINGE | INTRAMUSCULAR | Status: DC | PRN

## 2022-04-29 MED ORDER — ROCURONIUM BROMIDE 10 MG/ML (PF) SYRINGE
PREFILLED_SYRINGE | INTRAVENOUS | Status: DC | PRN
  Administered 2022-04-29: 40 mg via INTRAVENOUS
  Administered 2022-04-29 (×2): 20 mg via INTRAVENOUS
  Administered 2022-04-29: 60 mg via INTRAVENOUS

## 2022-04-29 MED ORDER — ROCURONIUM BROMIDE 10 MG/ML (PF) SYRINGE
PREFILLED_SYRINGE | INTRAVENOUS | Status: AC
Start: 2022-04-29 — End: ?
  Filled 2022-04-29: qty 10

## 2022-04-29 MED ORDER — ROCURONIUM BROMIDE 10 MG/ML (PF) SYRINGE
PREFILLED_SYRINGE | INTRAVENOUS | Status: AC
  Filled 2022-04-29: qty 10

## 2022-04-29 MED ORDER — ONDANSETRON HCL 4 MG PO TABS: 4.0000 mg | ORAL_TABLET | Freq: Four times a day (QID) | ORAL | Status: DC | PRN

## 2022-04-29 MED ORDER — LORAZEPAM 2 MG/ML IJ SOLN
0.5000 mg | INTRAMUSCULAR | Status: DC | PRN
  Administered 2022-04-29 – 2022-04-30 (×2): 0.5 mg via INTRAVENOUS
  Filled 2022-04-29 (×2): qty 1

## 2022-04-29 MED ORDER — CHLORHEXIDINE GLUCONATE 0.12 % MT SOLN
15.0000 mL | Freq: Once | OROMUCOSAL | Status: AC
  Administered 2022-04-29: 15 mL via OROMUCOSAL

## 2022-04-29 MED ORDER — ACETAMINOPHEN 10 MG/ML IV SOLN
1000.0000 mg | Freq: Four times a day (QID) | INTRAVENOUS | Status: AC
  Administered 2022-04-29 – 2022-04-30 (×3): 1000 mg via INTRAVENOUS
  Filled 2022-04-29 (×3): qty 100

## 2022-04-29 MED ORDER — PROMETHAZINE HCL 25 MG/ML IJ SOLN: 6.2500 mg | INTRAMUSCULAR | Status: DC | PRN

## 2022-04-29 MED ORDER — LACTATED RINGERS IV SOLN: INTRAVENOUS | Status: DC | PRN

## 2022-04-29 MED ORDER — LIDOCAINE HCL (PF) 2 % IJ SOLN
INTRAMUSCULAR | Status: AC
  Filled 2022-04-29: qty 10

## 2022-04-29 MED ORDER — HYDROMORPHONE HCL 1 MG/ML IJ SOLN
0.2000 mg | INTRAMUSCULAR | Status: DC | PRN
  Administered 2022-04-29: 0.6 mg via INTRAVENOUS
  Administered 2022-04-29: 0.5 mg via INTRAVENOUS
  Filled 2022-04-29 (×2): qty 1

## 2022-04-29 MED ORDER — DEXAMETHASONE SODIUM PHOSPHATE 10 MG/ML IJ SOLN
INTRAMUSCULAR | Status: AC
  Filled 2022-04-29: qty 1

## 2022-04-29 MED ORDER — KCL IN DEXTROSE-NACL 10-5-0.45 MEQ/L-%-% IV SOLN
INTRAVENOUS | Status: DC
  Filled 2022-04-29 (×4): qty 1000

## 2022-04-29 MED ORDER — SUGAMMADEX SODIUM 200 MG/2ML IV SOLN
INTRAVENOUS | Status: DC | PRN
  Administered 2022-04-29: 220 mg via INTRAVENOUS

## 2022-04-29 MED ORDER — CELECOXIB 200 MG PO CAPS
200.0000 mg | ORAL_CAPSULE | ORAL | Status: AC
  Administered 2022-04-29: 200 mg via ORAL
  Filled 2022-04-29: qty 1

## 2022-04-29 MED ORDER — ONDANSETRON HCL 4 MG/2ML IJ SOLN
INTRAMUSCULAR | Status: DC | PRN
  Administered 2022-04-29: 4 mg via INTRAVENOUS

## 2022-04-29 MED ORDER — ONDANSETRON HCL 4 MG/2ML IJ SOLN
INTRAMUSCULAR | Status: AC
  Filled 2022-04-29: qty 2

## 2022-04-29 MED ORDER — EPHEDRINE SULFATE-NACL 50-0.9 MG/10ML-% IV SOSY
PREFILLED_SYRINGE | INTRAVENOUS | Status: DC | PRN
  Administered 2022-04-29: 10 mg via INTRAVENOUS
  Administered 2022-04-29: 5 mg via INTRAVENOUS
  Administered 2022-04-29: 10 mg via INTRAVENOUS

## 2022-04-29 MED ORDER — LACTATED RINGERS IV SOLN: INTRAVENOUS | Status: DC

## 2022-04-29 MED ORDER — INDOCYANINE GREEN 25 MG IV SOLR
INTRAVENOUS | Status: DC | PRN
  Administered 2022-04-29: 2.5 mg

## 2022-04-29 MED ORDER — STERILE WATER FOR INJECTION IJ SOLN
INTRAMUSCULAR | Status: AC
Start: 2022-04-29 — End: ?
  Filled 2022-04-29: qty 10

## 2022-04-29 MED ORDER — LACTATED RINGERS IR SOLN
Status: DC | PRN
  Administered 2022-04-29: 1000 mL

## 2022-04-29 MED ORDER — MIDAZOLAM HCL 2 MG/2ML IJ SOLN
INTRAMUSCULAR | Status: AC
Start: 2022-04-29 — End: ?
  Filled 2022-04-29: qty 2

## 2022-04-29 MED ORDER — STERILE WATER FOR IRRIGATION IR SOLN
Status: DC | PRN
  Administered 2022-04-29: 1000 mL

## 2022-04-29 MED ORDER — PROPOFOL 10 MG/ML IV BOLUS
INTRAVENOUS | Status: AC
  Filled 2022-04-29: qty 20

## 2022-04-29 MED ORDER — OXYCODONE HCL 5 MG PO TABS: 5.0000 mg | ORAL_TABLET | Freq: Once | ORAL | Status: DC | PRN

## 2022-04-29 MED ORDER — LIDOCAINE 2% (20 MG/ML) 5 ML SYRINGE
INTRAMUSCULAR | Status: DC | PRN
  Administered 2022-04-29: 60 mg via INTRAVENOUS

## 2022-04-29 MED ORDER — FENTANYL CITRATE (PF) 100 MCG/2ML IJ SOLN
INTRAMUSCULAR | Status: DC | PRN
  Administered 2022-04-29: 100 ug via INTRAVENOUS

## 2022-04-29 MED ORDER — PHENOL 1.4 % MT LIQD
1.0000 | OROMUCOSAL | Status: DC | PRN
  Administered 2022-04-29: 1 via OROMUCOSAL
  Filled 2022-04-29: qty 177

## 2022-04-29 MED ORDER — LIDOCAINE HCL (PF) 2 % IJ SOLN
INTRAMUSCULAR | Status: AC
Start: 2022-04-29 — End: ?
  Filled 2022-04-29: qty 5

## 2022-04-29 MED ORDER — DEXAMETHASONE SODIUM PHOSPHATE 4 MG/ML IJ SOLN
4.0000 mg | INTRAMUSCULAR | Status: AC
  Administered 2022-04-29: 5 mg via INTRAVENOUS

## 2022-04-29 MED ORDER — STERILE WATER FOR INJECTION IJ SOLN
INTRAMUSCULAR | Status: AC
  Filled 2022-04-29: qty 10

## 2022-04-29 MED ORDER — BUPIVACAINE HCL 0.25 % IJ SOLN
INTRAMUSCULAR | Status: AC
  Filled 2022-04-29: qty 1

## 2022-04-29 MED ORDER — OXYCODONE HCL 5 MG/5ML PO SOLN: 5.0000 mg | Freq: Once | ORAL | Status: DC | PRN

## 2022-04-29 MED ORDER — CEFAZOLIN SODIUM-DEXTROSE 2-4 GM/100ML-% IV SOLN
2.0000 g | INTRAVENOUS | Status: AC
  Administered 2022-04-29: 2 g via INTRAVENOUS
  Filled 2022-04-29: qty 100

## 2022-04-29 MED ORDER — ONDANSETRON HCL 4 MG/2ML IJ SOLN: 4.0000 mg | Freq: Four times a day (QID) | INTRAMUSCULAR | Status: DC | PRN

## 2022-04-29 MED ORDER — PANTOPRAZOLE SODIUM 40 MG IV SOLR
40.0000 mg | Freq: Every day | INTRAVENOUS | Status: DC
  Administered 2022-04-29: 40 mg via INTRAVENOUS
  Filled 2022-04-29: qty 10

## 2022-04-29 MED ORDER — SCOPOLAMINE 1 MG/3DAYS TD PT72
1.0000 | MEDICATED_PATCH | TRANSDERMAL | Status: DC
  Administered 2022-04-29: 1.5 mg via TRANSDERMAL
  Filled 2022-04-29: qty 1

## 2022-04-29 MED ORDER — PHENYLEPHRINE HCL-NACL 20-0.9 MG/250ML-% IV SOLN
INTRAVENOUS | Status: DC | PRN
  Administered 2022-04-29: 25 ug/min via INTRAVENOUS

## 2022-04-29 MED ORDER — FENTANYL CITRATE (PF) 100 MCG/2ML IJ SOLN
INTRAMUSCULAR | Status: AC
  Filled 2022-04-29: qty 2

## 2022-04-29 MED ORDER — AMISULPRIDE (ANTIEMETIC) 5 MG/2ML IV SOLN: 10.0000 mg | Freq: Once | INTRAVENOUS | Status: DC | PRN

## 2022-04-29 MED ORDER — BUPIVACAINE HCL 0.25 % IJ SOLN
INTRAMUSCULAR | Status: DC | PRN
  Administered 2022-04-29: 34 mL

## 2022-04-29 SURGICAL SUPPLY — 77 items
ADH SKN CLS APL DERMABOND .7 (GAUZE/BANDAGES/DRESSINGS) ×2
AGENT HMST KT MTR STRL THRMB (HEMOSTASIS) ×2
APL ESCP 34 STRL LF DISP (HEMOSTASIS) ×2
APPLICATOR SURGIFLO ENDO (HEMOSTASIS) ×1 IMPLANT
BAG LAPAROSCOPIC 12 15 PORT 16 (BASKET) ×1 IMPLANT
BAG RETRIEVAL 12/15 (BASKET) ×2
BLADE SURG SZ10 CARB STEEL (BLADE) IMPLANT
COVER BACK TABLE 60X90IN (DRAPES) ×2 IMPLANT
COVER TIP SHEARS 8 DVNC (MISCELLANEOUS) ×2 IMPLANT
COVER TIP SHEARS 8MM DA VINCI (MISCELLANEOUS) ×2
DERMABOND ADVANCED (GAUZE/BANDAGES/DRESSINGS) ×2
DERMABOND ADVANCED .7 DNX12 (GAUZE/BANDAGES/DRESSINGS) ×2 IMPLANT
DRAPE ARM DVNC X/XI (DISPOSABLE) ×8 IMPLANT
DRAPE COLUMN DVNC XI (DISPOSABLE) ×2 IMPLANT
DRAPE DA VINCI XI ARM (DISPOSABLE) ×8
DRAPE DA VINCI XI COLUMN (DISPOSABLE) ×2
DRAPE SHEET LG 3/4 BI-LAMINATE (DRAPES) ×2 IMPLANT
DRAPE SURG IRRIG POUCH 19X23 (DRAPES) ×2 IMPLANT
DRSG OPSITE POSTOP 4X6 (GAUZE/BANDAGES/DRESSINGS) IMPLANT
DRSG OPSITE POSTOP 4X8 (GAUZE/BANDAGES/DRESSINGS) IMPLANT
ELECT PENCIL ROCKER SW 15FT (MISCELLANEOUS) IMPLANT
ELECT REM PT RETURN 15FT ADLT (MISCELLANEOUS) ×2 IMPLANT
GAUZE 4X4 16PLY ~~LOC~~+RFID DBL (SPONGE) ×4 IMPLANT
GLOVE BIO SURGEON STRL SZ 6 (GLOVE) ×8 IMPLANT
GLOVE BIO SURGEON STRL SZ 6.5 (GLOVE) ×2 IMPLANT
GOWN STRL REUS W/ TWL LRG LVL3 (GOWN DISPOSABLE) ×8 IMPLANT
GOWN STRL REUS W/TWL LRG LVL3 (GOWN DISPOSABLE) ×8
HOLDER FOLEY CATH W/STRAP (MISCELLANEOUS) IMPLANT
IRRIG SUCT STRYKERFLOW 2 WTIP (MISCELLANEOUS) ×2
IRRIGATION SUCT STRKRFLW 2 WTP (MISCELLANEOUS) ×2 IMPLANT
KIT PROCEDURE DA VINCI SI (MISCELLANEOUS) ×2
KIT PROCEDURE DVNC SI (MISCELLANEOUS) ×1 IMPLANT
KIT TURNOVER KIT A (KITS) IMPLANT
LIGASURE IMPACT 36 18CM CVD LR (INSTRUMENTS) IMPLANT
MANIPULATOR ADVINCU DEL 3.0 PL (MISCELLANEOUS) IMPLANT
MANIPULATOR ADVINCU DEL 3.5 PL (MISCELLANEOUS) ×1 IMPLANT
MANIPULATOR UTERINE 4.5 ZUMI (MISCELLANEOUS) IMPLANT
NDL HYPO 21X1.5 SAFETY (NEEDLE) ×1 IMPLANT
NDL SPNL 18GX3.5 QUINCKE PK (NEEDLE) IMPLANT
NEEDLE HYPO 21X1.5 SAFETY (NEEDLE) ×2 IMPLANT
NEEDLE SPNL 18GX3.5 QUINCKE PK (NEEDLE) IMPLANT
OBTURATOR OPTICAL STANDARD 8MM (TROCAR) ×2
OBTURATOR OPTICAL STND 8 DVNC (TROCAR) ×2
OBTURATOR OPTICALSTD 8 DVNC (TROCAR) ×2 IMPLANT
PACK ROBOT GYN CUSTOM WL (TRAY / TRAY PROCEDURE) ×2 IMPLANT
PAD POSITIONING PINK XL (MISCELLANEOUS) ×2 IMPLANT
PORT ACCESS TROCAR AIRSEAL 12 (TROCAR) ×2 IMPLANT
PORT ACCESS TROCAR AIRSEAL 5M (TROCAR) ×2
SCRUB CHG 4% DYNA-HEX 4OZ (MISCELLANEOUS) ×4 IMPLANT
SEAL CANN UNIV 5-8 DVNC XI (MISCELLANEOUS) ×8 IMPLANT
SEAL XI 5MM-8MM UNIVERSAL (MISCELLANEOUS) ×8
SET TRI-LUMEN FLTR TB AIRSEAL (TUBING) ×2 IMPLANT
SPIKE FLUID TRANSFER (MISCELLANEOUS) ×2 IMPLANT
SPONGE T-LAP 18X18 ~~LOC~~+RFID (SPONGE) IMPLANT
SURGIFLO W/THROMBIN 8M KIT (HEMOSTASIS) ×1 IMPLANT
SUT MNCRL AB 4-0 PS2 18 (SUTURE) IMPLANT
SUT PDS AB 1 TP1 96 (SUTURE) IMPLANT
SUT VIC AB 0 CT1 27 (SUTURE)
SUT VIC AB 0 CT1 27XBRD ANTBC (SUTURE) IMPLANT
SUT VIC AB 2-0 CT1 27 (SUTURE)
SUT VIC AB 2-0 CT1 TAPERPNT 27 (SUTURE) IMPLANT
SUT VIC AB 2-0 SH 27 (SUTURE) ×8
SUT VIC AB 2-0 SH 27X BRD (SUTURE) ×4 IMPLANT
SUT VIC AB 4-0 PS2 18 (SUTURE) ×4 IMPLANT
SUT VLOC 180 0 9IN  GS21 (SUTURE) ×2
SUT VLOC 180 0 9IN GS21 (SUTURE) ×1 IMPLANT
SYR 10ML LL (SYRINGE) IMPLANT
SYS BAG RETRIEVAL 10MM (BASKET)
SYS WOUND ALEXIS 18CM MED (MISCELLANEOUS)
SYSTEM BAG RETRIEVAL 10MM (BASKET) IMPLANT
SYSTEM WOUND ALEXIS 18CM MED (MISCELLANEOUS) IMPLANT
TOWEL OR NON WOVEN STRL DISP B (DISPOSABLE) IMPLANT
TRAP SPECIMEN MUCUS 40CC (MISCELLANEOUS) IMPLANT
TRAY FOLEY MTR SLVR 16FR STAT (SET/KITS/TRAYS/PACK) ×2 IMPLANT
UNDERPAD 30X36 HEAVY ABSORB (UNDERPADS AND DIAPERS) ×4 IMPLANT
WATER STERILE IRR 1000ML POUR (IV SOLUTION) ×2 IMPLANT
YANKAUER SUCT BULB TIP 10FT TU (MISCELLANEOUS) IMPLANT

## 2022-04-29 NOTE — Anesthesia Procedure Notes (Addendum)
Procedure Name: Intubation Date/Time: 04/29/2022 12:04 PM  Performed by: West Pugh, CRNAPre-anesthesia Checklist: Patient identified, Emergency Drugs available, Suction available, Patient being monitored and Timeout performed Patient Re-evaluated:Patient Re-evaluated prior to induction Oxygen Delivery Method: Circle system utilized Preoxygenation: Pre-oxygenation with 100% oxygen Induction Type: IV induction Ventilation: Mask ventilation without difficulty, Oral airway inserted - appropriate to patient size and Mask ventilation throughout procedure Laryngoscope Size: 3 and Glidescope Grade View: Grade I Tube type: Oral Tube size: 7.0 mm Number of attempts: 2 Airway Equipment and Method: Rigid stylet and Video-laryngoscopy Placement Confirmation: ETT inserted through vocal cords under direct vision, positive ETCO2, CO2 detector and breath sounds checked- equal and bilateral Secured at: 22 cm Tube secured with: Tape Dental Injury: Teeth and Oropharynx as per pre-operative assessment  Comments: Glidescope utilized due to dentition and small mouth opening. Attempted by CRNA x 1 without success. Dr Roanna Banning with glidescope attempt and successful.

## 2022-04-29 NOTE — Progress Notes (Signed)
Patient stuck 4 times for iv insertion.  #20 IV finally placed to right wrist on 4th stick.  Lidocaine used on all sticks at patient request.  Patient tearful and visibly shaken up.  Informed  Dr. Roanna Banning of hard stick status, requested 2nd IV be started in the OR, he states ok,they will take care of it.    Patients husband is at the bedside now.

## 2022-04-29 NOTE — Anesthesia Postprocedure Evaluation (Signed)
Anesthesia Post Note  Patient: TARAYA STEWARD  Procedure(s) Performed: XI ROBOTIC ASSISTED TOTAL HYSTERECTOMY WITH BILATERAL SALPINGO OOPHORECTOMY; OVERSEW STOMACH  (Bilateral) BILATERAL SENTINEL NODE BIOPSY (Bilateral)     Patient location during evaluation: PACU Anesthesia Type: General Level of consciousness: awake Pain management: pain level controlled Vital Signs Assessment: post-procedure vital signs reviewed and stable Respiratory status: spontaneous breathing, nonlabored ventilation, respiratory function stable and patient connected to nasal cannula oxygen Cardiovascular status: blood pressure returned to baseline and stable Postop Assessment: no apparent nausea or vomiting Anesthetic complications: no   No notable events documented.  Last Vitals:  Vitals:   04/29/22 1706 04/29/22 1811  BP: 134/76 138/89  Pulse: 77 79  Resp: 16 14  Temp: (!) 36.4 C 36.6 C  SpO2: 98% 96%    Last Pain:  Vitals:   04/29/22 1811  TempSrc: Oral  PainSc:                  Karyl Kinnier Aileena Iglesia

## 2022-04-29 NOTE — Interval H&P Note (Signed)
History and Physical Interval Note:  04/29/2022 11:04 AM  Brandy Jimenez  has presented today for surgery, with the diagnosis of COMPLEX ATYPICAL ENDOMETRIAL HYPERPLASIA.  The various methods of treatment have been discussed with the patient and family. After consideration of risks, benefits and other options for treatment, the patient has consented to  Procedure(s): XI ROBOTIC ASSISTED TOTAL HYSTERECTOMY WITH BILATERAL SALPINGO OOPHORECTOMY,POSSIBLE LAPAR OTOMY (Bilateral) SENTINEL NODE BIOPSY (N/A) POSSIBLE LYMPH NODE DISSECTION (N/A) as a surgical intervention.  The patient's history has been reviewed, patient examined, no change in status, stable for surgery.  I have reviewed the patient's chart and labs.  Questions were answered to the patient's satisfaction.     Lafonda Mosses

## 2022-04-29 NOTE — Progress Notes (Signed)
Patient was informed her surgeon is running behind.  Informed patient she is next but unable to give her a specific surgery start time as her surgeon is still in the OR with another patient.  Patient knodded with understanding. Apologized for the wait. Husband at the bedside, he heard update also.

## 2022-04-29 NOTE — Op Note (Addendum)
OPERATIVE NOTE  Pre-operative Diagnosis: EIN  Post-operative Diagnosis: same  Operation: Robotic-assisted laparoscopic total hysterectomy with bilateral salpingoophorectomy, SLN biopsy bilaterally, oversew of serosal injury to the stomach  Surgeon: Jeral Pinch MD  Assistant Surgeon: Joylene John NP  Anesthesia: GET  Urine Output: 200 cc  Operative Findings:  : On exam under anesthesia, small, somewhat anteverted, moderately mobile uterus.  On intra-abdominal entry, although OG tube had been placed, there was no decompression of the stomach.  A small serosal injury was noted at the site of entry.  Upper abdominal survey was normal.  Filmy adhesions of the omentum to the left anterior abdominal wall were taken down sharply.  Otherwise normal-appearing omentum, small and large bowel.  Uterus 8 cm and normal in appearance with a 2-3 cm calcified appearing lower uterine segment pedunculated fibroid.  Sigmoid mesentery adherent over the posterior aspect of this fibroid.  Atrophic appearing adnexa.  No obvious adenopathy.  No ascites.  Mapping successful to bilateral external iliac sentinel lymph nodes.  Significant adhesions of the lateral to the lower uterine segment and cervix consistent with prior C-section history.  Estimated Blood Loss:  125 cc      Total IV Fluids: see I&O flowsheet         Specimens: uterus, cervix, bilateral tubes and ovaries, bilateral external iliac sentinel lymph nodes         Complications: Serosal injury to the stomach upon entry secondary to lack of stomach decompression; patient tolerated the procedure well.         Disposition: PACU - hemodynamically stable.  Procedure Details  The patient was seen in the Holding Room. The risks, benefits, complications, treatment options, and expected outcomes were discussed with the patient.  The patient concurred with the proposed plan, giving informed consent.  The site of surgery properly noted/marked. The patient  was identified as Brandy Jimenez and the procedure verified as a Robotic-assisted hysterectomy with bilateral salpingo oophorectomy with SLN biopsy.   After induction of anesthesia, the patient was draped and prepped in the usual sterile manner. Patient was placed in supine position after anesthesia and draped and prepped in the usual sterile manner as follows: Her arms were tucked to her side with all appropriate precautions.  The shoulders were stabilized with padded shoulder blocks applied to the acromium processes.  The patient was placed in the semi-lithotomy position in [place].  The perineum and vagina were prepped with CholoraPrep. The patient was draped after the CholoraPrep had been allowed to dry for 3 minutes.  A Time Out was held and the above information confirmed.  The urethra was prepped with Betadine. Foley catheter was placed.  A sterile speculum was placed in the vagina.  The cervix was grasped with a single-tooth tenaculum. '2mg'$  total of ICG was injected into the cervical stroma at 2 and 9 o'clock with 1cc injected at a 1cm and 36m depth (concentration 0.'5mg'$ /ml) in all locations. The cervix was dilated with PKennon Roundsdilators.  The 3.5 Delineator uterine manipulator with a colpotomizer ring was placed without difficulty.  A pneum occluder balloon was placed over the manipulator.  OG tube placement was confirmed and to suction.   Next, a 10 mm skin incision was made 1 cm below the subcostal margin in the midclavicular line.  The 5 mm Optiview port and scope was used for direct entry.  Opening pressure was under 10 mm CO2.  The abdomen was insufflated and the findings were noted as above.  Upon insufflation, the  stomach was noted to be quite dilated.  Despite OG placement, the stomach did not appear to be decompressed.  There was a small area of serosal injury noted at the site of entry.  Decision made to oversew this at the end of surgery.  At this point and all points during the  procedure, the patient's intra-abdominal pressure did not exceed 15 mmHg. Next, an 8 mm skin incision was made superior the umbilicus and a right and left port were placed about 8 cm lateral to the robot port on the right and left side.  A fourth arm was placed on the right.  The 5 mm assist trocar was exchanged for a 10-12 mm port.  Filmy adhesions of the omentum to the anterior abdominal wall just to the left of midline were taken down sharply.  All ports were placed under direct visualization.  The patient was placed in steep Trendelenburg.  Bowel was folded away into the upper abdomen.  The robot was docked in the normal manner.  The right and left peritoneum were opened parallel to the IP ligament to open the retroperitoneal spaces bilaterally. The round ligaments were transected. The SLN mapping was performed in bilateral pelvic basins. After identifying the ureters, the para rectal and paravesical spaces were opened up entirely with careful dissection below the level of the ureters bilaterally and to the depth of the uterine artery origin in order to skeletonize the uterine "web" and ensure visualization of all parametrial channels. The para-aortic basins were carefully exposed and evaluated for isolated para-aortic SLN's. Lymphatic channels were identified travelling to the following visualized sentinel lymph node's: Bilateral external iliac sentinel lymph nodes. These SLN's were separated from their surrounding lymphatic tissue, removed and sent for permanent pathology.  The hysterectomy was started.  The ureter was again noted to be on the medial leaf of the broad ligament.  The peritoneum above the ureter was incised and stretched and the infundibulopelvic ligament was skeletonized, cauterized and cut.  The posterior peritoneum was taken down to the level of the KOH ring.  This dissection, the sigmoid mesentery, which was adherent to the lower uterine segment fibroid, was dissected free from the  fibroid, ultimately dropping the peritoneum and sigmoid mesentery below the level of the cervix.  The anterior peritoneum was also taken down.  The bladder flap was created to the level of the KOH ring.  Again adhesions were encountered during this dissection.  The uterine artery on the right side was skeletonized, cauterized and cut in the normal manner.  A similar procedure was performed on the left.  The colpotomy was made and the uterus, cervix, bilateral ovaries and tubes were amputated, they sent an Endo Catch bag and delivered through the vagina.  Pedicles were inspected and excellent hemostasis was achieved.    The colpotomy at the vaginal cuff was closed with Vicryl on a CT1 needle in a figure of 8 stitch at each apex.  The midline of the cuff was then closed with 0 V-Loc in running fashion.  Irrigation was used and excellent hemostasis was achieved.  Given some raw surfaces along the right surgical bed, Surgiflo was placed over this area.    At this point robotic instruments were removed, the robot was undocked, the patient flattened and placed in slight reverse Trendelenburg.  The robot was redocked and instruments were placed under direct visualization.  With 1 arm elevating the liver, the stomach surface was examined and the small, 5 mm serosal defect was  noted.  This was oversewn with 2 layers of interrupted 2-0 Vicryl.  The lesser sac was then entered below the short gastrics and this incision carried around the greater curvature of the stomach.  The stomach was elevated and the posterior surface examined with no injury noted.  The omentum was then reapproximated with running 2-0 Vicryl.    At this point in the procedure was completed.  Robotic instruments were removed under direct visulaization.  The robot was undocked. The fascia at the 10-12 mm port was closed with 0 Vicryl on a UR-5 needle.  The subcuticular tissue was closed with 4-0 Vicryl and the skin was closed with 4-0 Monocryl in a  subcuticular manner.  Dermabond was applied.    OG tube was exchanged for an NG tube.  X-ray confirming placement will be performed in the PACU.  The vagina was swabbed with minimal bleeding noted.  All sponge, lap and needle counts were correct x  3.   The patient was transferred to the recovery room in stable condition.  Jeral Pinch, MD

## 2022-04-29 NOTE — Brief Op Note (Signed)
04/29/2022  3:08 PM  PATIENT:  Brandy Jimenez  51 y.o. female  PRE-OPERATIVE DIAGNOSIS:  COMPLEX ATYPICAL ENDOMETRIAL HYPERPLASIA  POST-OPERATIVE DIAGNOSIS:  COMPLEX ATYPICAL ENDOMETRIAL HYPERPLASIA  PROCEDURE:  Procedure(s): XI ROBOTIC ASSISTED TOTAL HYSTERECTOMY WITH BILATERAL SALPINGO OOPHORECTOMY; OVERSEW STOMACH  (Bilateral) BILATERAL SENTINEL NODE BIOPSY (Bilateral)  SURGEON:  Surgeon(s) and Role:    Lafonda Mosses, MD - Primary  ASSISTANTS: Joylene John NP   ANESTHESIA:   general  EBL:  125 mL   BLOOD ADMINISTERED:none  DRAINS: none   LOCAL MEDICATIONS USED:  MARCAINE     SPECIMEN:  uterus, cervix, bilateral tubes and ovaries, bilateral external iliac SLNs  DISPOSITION OF SPECIMEN:  PATHOLOGY  COUNTS:  YES  TOURNIQUET:  * No tourniquets in log *  DICTATION: .Note written in EPIC  PLAN OF CARE: Admit for overnight observation  PATIENT DISPOSITION:  PACU - hemodynamically stable.   Delay start of Pharmacological VTE agent (>24hrs) due to surgical blood loss or risk of bleeding: not applicable

## 2022-04-29 NOTE — Anesthesia Preprocedure Evaluation (Signed)
Anesthesia Evaluation  Patient identified by MRN, date of birth, ID band Patient awake    Reviewed: Allergy & Precautions, NPO status , Patient's Chart, lab work & pertinent test results  History of Anesthesia Complications (+) PONV and history of anesthetic complications  Airway Mallampati: III  TM Distance: >3 FB Neck ROM: Full    Dental no notable dental hx.    Pulmonary neg pulmonary ROS,    Pulmonary exam normal        Cardiovascular hypertension, Pt. on medications Normal cardiovascular exam     Neuro/Psych PSYCHIATRIC DISORDERS negative neurological ROS     GI/Hepatic Neg liver ROS, GERD  Medicated and Controlled,  Endo/Other  Hypothyroidism Morbid obesity  Renal/GU negative Renal ROS     Musculoskeletal negative musculoskeletal ROS (+)   Abdominal   Peds  (+) ADHD Hematology negative hematology ROS (+)   Anesthesia Other Findings COMPLEX ATYPICAL ENDOMETRIAL HYPERPLASIA  Reproductive/Obstetrics                             Anesthesia Physical Anesthesia Plan  ASA: 3  Anesthesia Plan: General   Post-op Pain Management:    Induction: Intravenous  PONV Risk Score and Plan: 4 or greater and Ondansetron, Dexamethasone, Midazolam, Scopolamine patch - Pre-op and Treatment may vary due to age or medical condition  Airway Management Planned: Oral ETT  Additional Equipment:   Intra-op Plan:   Post-operative Plan: Extubation in OR  Informed Consent: I have reviewed the patients History and Physical, chart, labs and discussed the procedure including the risks, benefits and alternatives for the proposed anesthesia with the patient or authorized representative who has indicated his/her understanding and acceptance.     Dental advisory given  Plan Discussed with: CRNA  Anesthesia Plan Comments:         Anesthesia Quick Evaluation

## 2022-04-29 NOTE — Transfer of Care (Signed)
Immediate Anesthesia Transfer of Care Note  Patient: Brandy Jimenez  Procedure(s) Performed: XI ROBOTIC ASSISTED TOTAL HYSTERECTOMY WITH BILATERAL SALPINGO OOPHORECTOMY; OVERSEW STOMACH  (Bilateral) BILATERAL SENTINEL NODE BIOPSY (Bilateral)  Patient Location: PACU  Anesthesia Type:General  Level of Consciousness: drowsy  Airway & Oxygen Therapy: Patient Spontanous Breathing and Patient connected to face mask oxygen  Post-op Assessment: Report given to RN, Post -op Vital signs reviewed and stable and Patient moving all extremities X 4  Post vital signs: Reviewed and stable  Last Vitals:  Vitals Value Taken Time  BP 121/64 04/29/22 1515  Temp    Pulse 68 04/29/22 1518  Resp 21 04/29/22 1518  SpO2 100 % 04/29/22 1518  Vitals shown include unvalidated device data.  Last Pain:  Vitals:   04/29/22 0831  TempSrc:   PainSc: 0-No pain         Complications: No notable events documented.

## 2022-04-29 NOTE — Progress Notes (Signed)
Brief update  It with the patient and family after surgery once the patient was awake and in her room.  Reviewed findings at the time of surgery and what was performed.  Discussed serosal injury to the stomach on entry due to significant distention of the stomach despite OG tube.  Questions answered.  Plan will be for NG tube to low intermittent suction overnight.  If patient doing well in the morning, will discontinue and start on liquids.  Will advance to soft diet prior to discharge.  Plan will be for liquid/soft diet for 1 week and then returning to normal diet.  Jeral Pinch MD Gynecologic Oncology

## 2022-04-30 ENCOUNTER — Encounter (HOSPITAL_COMMUNITY): Payer: Self-pay | Admitting: Gynecologic Oncology

## 2022-04-30 DIAGNOSIS — D271 Benign neoplasm of left ovary: Secondary | ICD-10-CM | POA: Diagnosis not present

## 2022-04-30 DIAGNOSIS — K9172 Accidental puncture and laceration of a digestive system organ or structure during other procedure: Secondary | ICD-10-CM | POA: Diagnosis not present

## 2022-04-30 DIAGNOSIS — S3690XA Unspecified injury of unspecified intra-abdominal organ, initial encounter: Secondary | ICD-10-CM | POA: Diagnosis not present

## 2022-04-30 DIAGNOSIS — E039 Hypothyroidism, unspecified: Secondary | ICD-10-CM | POA: Diagnosis not present

## 2022-04-30 DIAGNOSIS — N879 Dysplasia of cervix uteri, unspecified: Secondary | ICD-10-CM | POA: Diagnosis not present

## 2022-04-30 DIAGNOSIS — D251 Intramural leiomyoma of uterus: Secondary | ICD-10-CM | POA: Diagnosis not present

## 2022-04-30 DIAGNOSIS — Z79899 Other long term (current) drug therapy: Secondary | ICD-10-CM | POA: Diagnosis not present

## 2022-04-30 DIAGNOSIS — N8502 Endometrial intraepithelial neoplasia [EIN]: Secondary | ICD-10-CM | POA: Diagnosis not present

## 2022-04-30 DIAGNOSIS — I10 Essential (primary) hypertension: Secondary | ICD-10-CM | POA: Diagnosis not present

## 2022-04-30 DIAGNOSIS — D270 Benign neoplasm of right ovary: Secondary | ICD-10-CM | POA: Diagnosis not present

## 2022-04-30 LAB — CBC
HCT: 40.4 % (ref 36.0–46.0)
Hemoglobin: 13.5 g/dL (ref 12.0–15.0)
MCH: 27.3 pg (ref 26.0–34.0)
MCHC: 33.4 g/dL (ref 30.0–36.0)
MCV: 81.6 fL (ref 80.0–100.0)
Platelets: 186 10*3/uL (ref 150–400)
RBC: 4.95 MIL/uL (ref 3.87–5.11)
RDW: 13.7 % (ref 11.5–15.5)
WBC: 13.3 10*3/uL — ABNORMAL HIGH (ref 4.0–10.5)
nRBC: 0 % (ref 0.0–0.2)

## 2022-04-30 LAB — COMPREHENSIVE METABOLIC PANEL
ALT: 36 U/L (ref 0–44)
AST: 32 U/L (ref 15–41)
Albumin: 3.8 g/dL (ref 3.5–5.0)
Alkaline Phosphatase: 84 U/L (ref 38–126)
Anion gap: 11 (ref 5–15)
BUN: 10 mg/dL (ref 6–20)
CO2: 27 mmol/L (ref 22–32)
Calcium: 8.5 mg/dL — ABNORMAL LOW (ref 8.9–10.3)
Chloride: 100 mmol/L (ref 98–111)
Creatinine, Ser: 0.68 mg/dL (ref 0.44–1.00)
GFR, Estimated: >60 mL/min (ref >60)
Glucose, Bld: 139 mg/dL — ABNORMAL HIGH (ref 70–99)
Potassium: 3.8 mmol/L (ref 3.5–5.1)
Sodium: 138 mmol/L (ref 135–145)
Total Bilirubin: 0.8 mg/dL (ref 0.3–1.2)
Total Protein: 7 g/dL (ref 6.5–8.1)

## 2022-04-30 MED ORDER — SIMETHICONE 80 MG PO CHEW
40.0000 mg | CHEWABLE_TABLET | Freq: Four times a day (QID) | ORAL | Status: DC | PRN
  Administered 2022-04-30: 40 mg via ORAL
  Filled 2022-04-30: qty 1

## 2022-04-30 MED ORDER — ONDANSETRON HCL 4 MG PO TABS: 4.0000 mg | ORAL_TABLET | Freq: Four times a day (QID) | ORAL | Status: DC | PRN

## 2022-04-30 MED ORDER — ACETAMINOPHEN 325 MG PO TABS
650.0000 mg | ORAL_TABLET | Freq: Four times a day (QID) | ORAL | Status: DC | PRN
  Administered 2022-05-01: 650 mg via ORAL
  Filled 2022-04-30 (×2): qty 2

## 2022-04-30 MED ORDER — OXYCODONE HCL 5 MG PO TABS
5.0000 mg | ORAL_TABLET | ORAL | Status: DC | PRN
  Administered 2022-04-30 (×2): 5 mg via ORAL
  Filled 2022-04-30 (×3): qty 1

## 2022-04-30 MED ORDER — LEVOTHYROXINE SODIUM 75 MCG PO TABS
75.0000 ug | ORAL_TABLET | Freq: Every day | ORAL | Status: DC
  Administered 2022-05-01: 75 ug via ORAL
  Filled 2022-04-30: qty 1

## 2022-04-30 MED ORDER — LORAZEPAM 0.5 MG PO TABS: 0.5000 mg | ORAL_TABLET | Freq: Four times a day (QID) | ORAL | Status: DC | PRN

## 2022-04-30 MED ORDER — PANTOPRAZOLE SODIUM 40 MG PO TBEC
40.0000 mg | DELAYED_RELEASE_TABLET | Freq: Every day | ORAL | Status: DC
  Administered 2022-04-30 – 2022-05-01 (×2): 40 mg via ORAL
  Filled 2022-04-30 (×2): qty 1

## 2022-04-30 MED ORDER — ONDANSETRON HCL 4 MG/2ML IJ SOLN: 4.0000 mg | Freq: Four times a day (QID) | INTRAMUSCULAR | Status: DC | PRN

## 2022-04-30 NOTE — Progress Notes (Signed)
Mobility Specialist - Progress Note    04/30/22 0854  Mobility  HOB Elevated/Bed Position Self regulated  Activity Ambulated independently in hallway  Range of Motion/Exercises Active  Level of Assistance Independent  Assistive Device None  Distance Ambulated (ft) 250 ft  Activity Response Tolerated well  Transport method Ambulatory  $Mobility charge 1 Mobility   Pt received in hallway w/ sister and agreeable to mobility.  Pt to recliner after session with all needs met.    [facility]

## 2022-04-30 NOTE — Progress Notes (Signed)
Gynecologic Oncology Progress Note  Patient resting in bed comfortably with husband at the bedside. Has tolerated liquid diet with no nausea or emesis. She has been coughing up phelgm. No fever-like symptoms. Has been ambulating without difficulty. Voiding adequate amounts. Abdominal incisional discomfort managed with prn medications. Minimal amount of vaginal spotting reported. Using IS. All questions answered. She is agreeable with the plan for diet advancement. If continuing to do well, meeting milestones, will plan for discharge in the am.

## 2022-04-30 NOTE — Progress Notes (Signed)
Brief update  Saw the patient this evening.  She is doing well.  Has some mild pain related to her incisions, otherwise denies any pain.  Tolerated liquids without any nausea or emesis.  Voiding freely.  Reports some flatus.  Has been up and walking.  Plan to advance diet to soft.  If doing well in the morning, plan to discharge home tomorrow with continued liquid/soft diet for approximately 1 week.  Jeral Pinch MD Gynecologic Oncology

## 2022-04-30 NOTE — TOC Progression Note (Signed)
Transition of Care [facility]) - Progression Note    Patient Details  Name: KEMARIA DEDIC MRN: [id number] Date of Birth: 12/20/1970  Transition of Care [facility]) CM/SW Contact  Servando Snare, [place] Phone Number: 04/30/2022, 10:09 AM  Clinical Narrative:      Transition of Care [facility]) Screening Note   Patient Details  Name: LIZZY HAMRE Date of Birth: 1971-04-13   Transition of Care [facility]) CM/SW Contact:    Servando Snare, LCSW Phone Number: 04/30/2022, 10:09 AM    Transition of Care Department [facility]) has reviewed patient and no TOC needs have been identified at this time. We will continue to monitor patient advancement through interdisciplinary progression rounds. If new patient transition needs arise, please place a TOC consult.         Expected Discharge Plan and Services                                                 Social Determinants of Health (SDOH) Interventions    Readmission Risk Interventions     No data to display

## 2022-04-30 NOTE — Progress Notes (Signed)
Mobility Specialist - Progress Note   04/30/22 1539  Mobility  HOB Elevated/Bed Position Self regulated  Activity Ambulated independently in hallway  Range of Motion/Exercises Active  Level of Assistance Independent  Assistive Device None  Distance Ambulated (ft) 250 ft  Activity Response Tolerated well  Transport method Ambulatory  $Mobility charge 1 Mobility   Pt received in bed and agreeable to mobility. Pt to restroom after session with all needs met.       [facility]

## 2022-04-30 NOTE — Progress Notes (Signed)
1 Day Post-Op Procedure(s) (LRB): XI ROBOTIC ASSISTED TOTAL HYSTERECTOMY WITH BILATERAL SALPINGO OOPHORECTOMY; OVERSEW STOMACH  (Bilateral) BILATERAL SENTINEL NODE BIOPSY (Bilateral)  Subjective: Patient reports having sore throat with little relief from chloraseptic spray. No nausea or emesis reported overnight. Passing flatus. Pain managed with oral mediations. Feel steady when out of bed. Denies chest pain, dyspnea. All questions answered.   Objective: Vital signs in last 24 hours: Temp:  [97.3 F (36.3 C)-98.9 F (37.2 C)] 98 F (36.7 C) (08/24 0524) Pulse Rate:  [62-99] 95 (08/24 0524) Resp:  [12-21] 18 (08/24 0524) BP: (97-154)/(51-99) 137/97 (08/24 0524) SpO2:  [93 %-100 %] 94 % (08/24 0524) Last BM Date : 04/30/22  Intake/Output from previous day: 08/23 0701 - 08/24 0700 In: 2262.3 [I.V.:1962.3; IV Piggyback:300] Out: 2525 [Urine:2400; Blood:125]  Physical Examination: General: alert, cooperative, and no distress Resp: clear to auscultation bilaterally Cardio: regular rate and rhythm, S1, S2 normal, no murmur, click, rub or gallop GI: incision: abdomen with lap site incisions intact with dermabond present and mildly hypoactive bowel sounds, abdomen soft  Extremities: extremities normal, atraumatic, no cyanosis or edema NG with around 150 cc of light brown/orange output-removed by Dr. Berline Lopes without difficulty. Foley catheter with clear, yellow urine-removed by Dr. Berline Lopes without difficulty. 150 cc in the bag.  Labs: WBC/Hgb/Hct/Plts:  13.3/13.5/40.4/186 (08/24 0505) BUN/Cr/glu/ALT/AST/amyl/lip:  10/0.68/--/36/32/--/-- (08/24 0505)  Assessment: 51 y.o. s/p Procedure(s): XI ROBOTIC ASSISTED TOTAL HYSTERECTOMY WITH BILATERAL SALPINGO OOPHORECTOMY; OVERSEW STOMACH  BILATERAL SENTINEL NODE BIOPSY: stable Pain:  Pain is well-controlled on PRN medications.  Heme: Hgb 13.5 and Hct 40.4 this am-appropriate compared with preop values and surgical losses.  CV: BP and HR  stable-continue with routine vital signs until discharge  GI:  Tolerating po: No: NPO with NG in place due to serosal injury to the stomach. NG tube removed this am and pt to start sipping on clears. If tolerating liquids, plan for advancement to soft diet. Antiemetics ordered if needed.  GU: Adequate output overnight. Creatinine 0.68 this am. Foley removed-pt DVT.     FEN: No critical values on am labs.  Prophylaxis: SCDs and lovenox ordered.  Plan: NG tube out. Foley removed. Diet to clear liquids IVF to 50 Medications to be changed to PO Encourage ambulation, IS use Plan for possible discharge tomorrow if meeting milestones, tolerating diet Continue plan of care per Dr. Berline Lopes   LOS: 0 days    Brandy Jimenez 04/30/2022, 8:55 AM

## 2022-05-01 DIAGNOSIS — Z79899 Other long term (current) drug therapy: Secondary | ICD-10-CM | POA: Diagnosis not present

## 2022-05-01 DIAGNOSIS — N8502 Endometrial intraepithelial neoplasia [EIN]: Secondary | ICD-10-CM | POA: Diagnosis not present

## 2022-05-01 DIAGNOSIS — K9172 Accidental puncture and laceration of a digestive system organ or structure during other procedure: Secondary | ICD-10-CM | POA: Diagnosis not present

## 2022-05-01 DIAGNOSIS — D270 Benign neoplasm of right ovary: Secondary | ICD-10-CM | POA: Diagnosis not present

## 2022-05-01 DIAGNOSIS — N879 Dysplasia of cervix uteri, unspecified: Secondary | ICD-10-CM | POA: Diagnosis not present

## 2022-05-01 DIAGNOSIS — E039 Hypothyroidism, unspecified: Secondary | ICD-10-CM | POA: Diagnosis not present

## 2022-05-01 DIAGNOSIS — I10 Essential (primary) hypertension: Secondary | ICD-10-CM | POA: Diagnosis not present

## 2022-05-01 DIAGNOSIS — D251 Intramural leiomyoma of uterus: Secondary | ICD-10-CM | POA: Diagnosis not present

## 2022-05-01 DIAGNOSIS — D271 Benign neoplasm of left ovary: Secondary | ICD-10-CM | POA: Diagnosis not present

## 2022-05-01 LAB — CBC
HCT: 35.4 % — ABNORMAL LOW (ref 36.0–46.0)
Hemoglobin: 11.5 g/dL — ABNORMAL LOW (ref 12.0–15.0)
MCH: 27.4 pg (ref 26.0–34.0)
MCHC: 32.5 g/dL (ref 30.0–36.0)
MCV: 84.3 fL (ref 80.0–100.0)
Platelets: 222 10*3/uL (ref 150–400)
RBC: 4.2 MIL/uL (ref 3.87–5.11)
RDW: 13.7 % (ref 11.5–15.5)
WBC: 8.6 10*3/uL (ref 4.0–10.5)
nRBC: 0 % (ref 0.0–0.2)

## 2022-05-01 LAB — BASIC METABOLIC PANEL
Anion gap: 8 (ref 5–15)
BUN: 9 mg/dL (ref 6–20)
CO2: 26 mmol/L (ref 22–32)
Calcium: 8.1 mg/dL — ABNORMAL LOW (ref 8.9–10.3)
Chloride: 106 mmol/L (ref 98–111)
Creatinine, Ser: 0.61 mg/dL (ref 0.44–1.00)
GFR, Estimated: >60 mL/min (ref >60)
Glucose, Bld: 95 mg/dL (ref 70–99)
Potassium: 3.4 mmol/L — ABNORMAL LOW (ref 3.5–5.1)
Sodium: 140 mmol/L (ref 135–145)

## 2022-05-01 LAB — HEMOGLOBIN AND HEMATOCRIT, BLOOD
HCT: 37.3 % (ref 36.0–46.0)
Hemoglobin: 12 g/dL (ref 12.0–15.0)

## 2022-05-01 NOTE — Discharge Instructions (Signed)
AFTER SURGERY INSTRUCTIONS   Return to work: 4-6 weeks if applicable  YOU WILL NEED TO BE ON A SOFT/LIQUID DIET FOR THE NEXT WEEK. MAKE SURE YOU CHEW YOUR FOOD WELL. PLEASE CALL THE OFFICE FOR ANY NEW SYMPTOMS SUCH HAS FEVER, CHILLS, SEVERE ABDOMINAL PAIN, ANY NEW SYMPTOMS.  YOU CAN ALTERNATE TYLENOL ([phone] MG with max of 4000 mg in 24 hour period) AND IBUPROFEN (800 mg every 8-12 hours) EVERY SIX HOURS.   Activity: 1. Be up and out of the bed during the day.  Take a nap if needed.  You may walk up steps but be careful and use the hand rail.  Stair climbing will tire you more than you think, you may need to stop part way and rest.    2. No lifting or straining for 6 weeks over 10 pounds. No pushing, pulling, straining for 6 weeks.   3. No driving for around 1 week(s).  Do not drive if you are taking narcotic pain medicine and make sure that your reaction time has returned.    4. You can shower as soon as the next day after surgery. Shower daily.  Use your regular soap and water (not directly on the incision) and pat your incision(s) dry afterwards; don't rub.  No tub baths or submerging your body in water until cleared by your surgeon. If you have the soap that was given to you by pre-surgical testing that was used before surgery, you do not need to use it afterwards because this can irritate your incisions.    5. No sexual activity and nothing in the vagina for 8-10 weeks.   6. You may experience a small amount of clear drainage from your incisions, which is normal.  If the drainage persists, increases, or changes color please call the office.   7. Do not use creams, lotions, or ointments such as neosporin on your incisions after surgery until advised by your surgeon because they can cause removal of the dermabond glue on your incisions.     8. You may experience vaginal spotting after surgery or around the 6-8 week mark from surgery when the stitches at the top of the vagina begin to  dissolve.  The spotting is normal but if you experience heavy bleeding, call our office.   9. Take Tylenol or ibuprofen first for pain if you are able to take these medications and only use narcotic pain medication for severe pain not relieved by the Tylenol or Ibuprofen.  Monitor your Tylenol intake to a max of 4,000 mg in a 24 hour period. You can alternate these medications after surgery.   Diet: 1. Low sodium Heart Healthy Diet is recommended but you are cleared to resume your normal (before surgery) diet after your procedure.   2. It is safe to use a laxative, such as Miralax or Colace, if you have difficulty moving your bowels. You have been prescribed Sennakot-S to take at bedtime every evening after surgery to keep bowel movements regular and to prevent constipation.     Wound Care: 1. Keep clean and dry.  Shower daily.   Reasons to call the Doctor: Fever - Oral temperature greater than 100.4 degrees Fahrenheit Foul-smelling vaginal discharge Difficulty urinating Nausea and vomiting Increased pain at the site of the incision that is unrelieved with pain medicine. Difficulty breathing with or without chest pain New calf pain especially if only on one side Sudden, continuing increased vaginal bleeding with or without clots.   Contacts: For questions  or concerns you should contact:   Dr. Jeral Pinch at [phone]   Joylene John, NP at [phone]   After Hours: call [phone] and have the GYN Oncologist paged/contacted (after 5 pm or on the weekends).   Messages sent via mychart are for non-urgent matters and are not responded to after hours so for urgent needs, please call the after hours number.

## 2022-05-01 NOTE — Progress Notes (Signed)
Discharge instructions discussed with patient and family, verbalized agreement and understanding 

## 2022-05-01 NOTE — Discharge Summary (Signed)
Physician Discharge Summary  Patient ID: Brandy Jimenez MRN: [id number] DOB/AGE: May 14, 1971 51 y.o.  Admit date: 04/29/2022 Discharge date: 05/01/2022  Admission Diagnoses: Complex atypical endometrial hyperplasia  Discharge Diagnoses:  Principal Problem:   Complex atypical endometrial hyperplasia Active Problems:   Injury of gastrointestinal tract   Discharged Condition:  The patient is in good condition and stable for discharge.    Hospital Course: On 04/29/2022, the patient underwent the following: Procedure(s): XI ROBOTIC ASSISTED TOTAL HYSTERECTOMY WITH BILATERAL SALPINGO OOPHORECTOMY; OVERSEW STOMACH BILATERAL SENTINEL NODE BIOPSY. The postoperative course included NG tube decompression overnight into POD 1 due to oversewing of the stomach intraop for a serosal injury. On POD 1, the NG and foley were removed. The patient tolerated clear liquids along with a soft diet that followed. She was discharged to home on postoperative day 2 tolerating a soft diet, voiding, pain controlled, passing flatus. A repeat H&H was performed prior to discharge and was stable due to drop from POD 1 to POD 2.   Consults: None  Significant Diagnostic Studies: Labs  Treatments: surgery: see above, NG tube decompression  Discharge Exam: Blood pressure (!) 147/86, pulse 84, temperature 97.6 F (36.4 C), temperature source Oral, resp. rate 16, height '5\' 3"'$  (1.6 m), weight 231 lb 14.8 oz (105.2 kg), last menstrual period 06/28/2015, SpO2 95 %. General appearance: alert, cooperative, and no distress Resp: clear to auscultation bilaterally Cardio: regular rate and rhythm, S1, S2 normal, no murmur, click, rub or gallop GI: soft, non-tender; bowel sounds normal; no masses,  no organomegaly Extremities: extremities normal, atraumatic, no cyanosis or edema Incision/Wound: lap sites to the abdomen with dermabond without erythema or drainage Minimal vaginal spotting reported  Disposition: Discharge  disposition: 01-Home or Self Care       Discharge Instructions     Call MD for:  difficulty breathing, headache or visual disturbances   Complete by: As directed    Call MD for:  extreme fatigue   Complete by: As directed    Call MD for:  hives   Complete by: As directed    Call MD for:  persistant dizziness or light-headedness   Complete by: As directed    Call MD for:  persistant nausea and vomiting   Complete by: As directed    Call MD for:  redness, tenderness, or signs of infection (pain, swelling, redness, odor or green/yellow discharge around incision site)   Complete by: As directed    Call MD for:  severe uncontrolled pain   Complete by: As directed    Call MD for:  temperature >100.4   Complete by: As directed    Diet - low sodium heart healthy   Complete by: As directed    Driving Restrictions   Complete by: As directed    No driving for 1 week(s).  Do not take narcotics and drive. You need to make sure your reaction time has returned.   Increase activity slowly   Complete by: As directed    Lifting restrictions   Complete by: As directed    No lifting greater than 10 lbs, pushing, pulling, straining for 6 weeks.   No wound care   Complete by: As directed    Sexual Activity Restrictions   Complete by: As directed    No sexual activity, nothing in the vagina, for 8-10 weeks.      Allergies as of 05/01/2022       Reactions   Codeine Nausea And Vomiting, Other (See Comments)  Hallucinations and N/V   Tape Other (See Comments)   Patient states adhesive tape irritates her skin, paper tape is more comfortable.   Tramadol Other (See Comments)   Keeps pt awake        Medication List     TAKE these medications    famotidine-calcium carbonate-magnesium hydroxide 10-800-165 MG chewable tablet Commonly known as: PEPCID COMPLETE Chew 1 tablet by mouth at bedtime as needed (acid reflux).   hydrOXYzine 25 MG capsule Commonly known as: VISTARIL TAKE 1  CAPSULE BY MOUTH EVERY DAY AT BEDTIME AS NEEDED What changed: See the new instructions.   ibuprofen 800 MG tablet Commonly known as: ADVIL Take 1 tablet (800 mg total) by mouth every 8 (eight) hours as needed for moderate pain. For AFTER surgery only   levothyroxine 75 MCG tablet Commonly known as: SYNTHROID Take 1 tablet (75 mcg total) by mouth daily. What changed: when to take this   losartan-hydrochlorothiazide 100-25 MG tablet Commonly known as: HYZAAR TAKE 1 TABLET BY MOUTH EVERY DAY What changed:  how to take this when to take this   nystatin powder Commonly known as: MYCOSTATIN/NYSTOP Apply 1 Application topically 3 (three) times daily. Apply to the skin fold under your lower abdomen (pannus)   oxyCODONE 5 MG immediate release tablet Commonly known as: Oxy IR/ROXICODONE Take 1 tablet (5 mg total) by mouth every 4 (four) hours as needed for severe pain. For AFTER surgery only, do not take and drive   senna-docusate 8.6-50 MG tablet Commonly known as: Senokot-S Take 2 tablets by mouth at bedtime. For AFTER surgery, do not take if having diarrhea   Vitamin D (Ergocalciferol) 1.25 MG (50000 UNIT) Caps capsule Commonly known as: DRISDOL TAKE 1 CAPSULE TWICE A WEEK        Follow-up Information     Lafonda Mosses, MD Follow up on 05/08/2022.   Specialty: Gynecologic Oncology Why: at 4:20pm will be a PHONE visit to check in and discuss pathology. IN PERSON visit will be on 05/18/22 at 3:30pm at the [facility]. Contact information: [address] [phone]                 Greater than thirty minutes were spend for face to face discharge instructions and discharge orders/summary in EPIC.   Signed: Dorothyann Gibbs 05/01/2022, 9:12 AM

## 2022-05-04 ENCOUNTER — Telehealth: Payer: Self-pay

## 2022-05-04 ENCOUNTER — Encounter: Payer: Self-pay | Admitting: Gynecologic Oncology

## 2022-05-04 LAB — SURGICAL PATHOLOGY

## 2022-05-04 NOTE — Progress Notes (Signed)
I called the patient to review pathology from surgery. She's over all doing well, meeting postoperative milestones. She was very happy with pathology results. All questions answered.  Berline Lopes MD

## 2022-05-04 NOTE — Telephone Encounter (Signed)
Spoke with Brandy Jimenez this morning. She states she is eating, drinking and urinating well. She has had a BM and is passing gas. She is taking senokot as prescribed and encouraged her to drink plenty of water. She denies fever or chills. Incisions are dry and intact. She rates her pain 1-2/10. Her pain is controlled with Tylenol. Has taken Oxycodone only once on Saturday 8/26.     Pt states starting yesterday and today periodically noticed "nerve pain" kind of tingly on the upper outer right thigh. She notices it mostly when she walks. There is no redness/warm to touch or swelling.  Pt aware I will notify Joylene John NP and call back with advise. She voiced an understanding.

## 2022-05-04 NOTE — Telephone Encounter (Signed)
I called pt to relay [employer] NP's message and she states she was just on the phone with Dr. Berline Lopes. She will call with any questions and concerns.

## 2022-05-05 ENCOUNTER — Encounter: Payer: Self-pay | Admitting: Gynecologic Oncology

## 2022-05-08 ENCOUNTER — Telehealth: Payer: BC Managed Care – PPO | Admitting: Gynecologic Oncology

## 2022-05-08 ENCOUNTER — Telehealth: Payer: Self-pay

## 2022-05-08 DIAGNOSIS — H04123 Dry eye syndrome of bilateral lacrimal glands: Secondary | ICD-10-CM | POA: Diagnosis not present

## 2022-05-08 NOTE — Telephone Encounter (Signed)
Brandy Jimenez states that she woke up this morning with her right eye bright red and itchy. No exudate.  She had surgery on 04-29-22 and needed a NG tube in for a couple of days and did not know if it would re related to her reddened eye. She also notes that her right thigh continues with intermittent tingling which radiates down to her knee.  It is more noticeable when she is in bed. It can interfere with her sleep. Pain not worse. It is not improving. She has mobility of foot and leg. She walks 2 miles a day. She has no redness or swelling in her calves. No leg calf pain with bearing weight bilaterally. She states that she uses Ibuprofen 400 mg q 8 hours alt. With tylenol 1000 mg q 6 hours.for pain. Heating pad helps as well. Reviewed above with Joylene John, NP. Pt needs to have her eye evaluated  by her eye doctor. The eye redness is not related to her having the NG tube post op.  The thigh tingling  from the nerves being cut during surgery. It can take 2-3 weeks for this to be resolved. She can try using an over the counter lidocaine patch to the thigh.  If the pain gets worse prior to post-op visit on 05-18-22 call the office. Pt verbalized understanding.

## 2022-05-18 ENCOUNTER — Encounter: Payer: Self-pay | Admitting: Gynecologic Oncology

## 2022-05-18 ENCOUNTER — Inpatient Hospital Stay: Payer: BC Managed Care – PPO | Attending: Gynecologic Oncology | Admitting: Gynecologic Oncology

## 2022-05-18 ENCOUNTER — Other Ambulatory Visit: Payer: Self-pay

## 2022-05-18 VITALS — BP 133/98 | HR 109 | Temp 98.0°F | Resp 16 | Ht 62.99 in | Wt 230.0 lb

## 2022-05-18 DIAGNOSIS — E66813 Obesity, class 3: Secondary | ICD-10-CM

## 2022-05-18 DIAGNOSIS — N8502 Endometrial intraepithelial neoplasia [EIN]: Secondary | ICD-10-CM

## 2022-05-18 DIAGNOSIS — R232 Flushing: Secondary | ICD-10-CM

## 2022-05-18 DIAGNOSIS — Z90710 Acquired absence of both cervix and uterus: Secondary | ICD-10-CM

## 2022-05-18 DIAGNOSIS — Z7189 Other specified counseling: Secondary | ICD-10-CM

## 2022-05-18 DIAGNOSIS — Z90722 Acquired absence of ovaries, bilateral: Secondary | ICD-10-CM

## 2022-05-18 MED ORDER — ESTRADIOL 0.025 MG/24HR TD PTWK: 0.0250 mg | MEDICATED_PATCH | TRANSDERMAL | 12 refills | Status: DC

## 2022-05-18 NOTE — Patient Instructions (Addendum)
It was good to see you today.  You are healing well from surgery!  Remember, no heavy lifting for at least 6 weeks after surgery and nothing in the vagina for 8-10.  Please let me know over the next few weeks how your hot flashes are after starting the estrogen patch.  If we need to go up on the dose of the patch, this can be easily done.
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
## 2022-05-20 ENCOUNTER — Telehealth: Payer: Self-pay

## 2022-05-20 NOTE — Telephone Encounter (Signed)
[employer] and [place] disability form faxed and confirmed.

## 2022-05-25 DIAGNOSIS — J324 Chronic pansinusitis: Secondary | ICD-10-CM | POA: Diagnosis not present

## 2022-06-05 ENCOUNTER — Encounter: Payer: Self-pay | Admitting: Gynecologic Oncology

## 2022-06-15 ENCOUNTER — Encounter: Payer: Self-pay | Admitting: Gynecologic Oncology

## 2022-06-15 ENCOUNTER — Other Ambulatory Visit: Payer: Self-pay | Admitting: Gynecologic Oncology

## 2022-06-15 DIAGNOSIS — R232 Flushing: Secondary | ICD-10-CM

## 2022-06-15 MED ORDER — ESTRADIOL 0.025 MG/24HR TD PTWK: 0.0250 mg | MEDICATED_PATCH | TRANSDERMAL | 3 refills | Status: DC

## 2022-07-13 ENCOUNTER — Other Ambulatory Visit: Payer: Self-pay | Admitting: Physician Assistant

## 2022-07-13 DIAGNOSIS — F5101 Primary insomnia: Secondary | ICD-10-CM

## 2022-07-13 DIAGNOSIS — I10 Essential (primary) hypertension: Secondary | ICD-10-CM

## 2022-07-17 ENCOUNTER — Encounter: Payer: Self-pay | Admitting: Physician Assistant

## 2022-07-17 ENCOUNTER — Ambulatory Visit (INDEPENDENT_AMBULATORY_CARE_PROVIDER_SITE_OTHER): Payer: BC Managed Care – PPO | Admitting: Physician Assistant

## 2022-07-17 VITALS — BP 120/80 | HR 72 | Temp 97.5°F | Ht 63.0 in | Wt 232.6 lb

## 2022-07-17 DIAGNOSIS — E039 Hypothyroidism, unspecified: Secondary | ICD-10-CM

## 2022-07-17 DIAGNOSIS — I10 Essential (primary) hypertension: Secondary | ICD-10-CM

## 2022-07-17 DIAGNOSIS — Z23 Encounter for immunization: Secondary | ICD-10-CM

## 2022-07-17 DIAGNOSIS — E782 Mixed hyperlipidemia: Secondary | ICD-10-CM | POA: Diagnosis not present

## 2022-07-17 DIAGNOSIS — F5101 Primary insomnia: Secondary | ICD-10-CM | POA: Diagnosis not present

## 2022-07-17 DIAGNOSIS — R739 Hyperglycemia, unspecified: Secondary | ICD-10-CM

## 2022-07-17 DIAGNOSIS — Z90710 Acquired absence of both cervix and uterus: Secondary | ICD-10-CM

## 2022-07-17 DIAGNOSIS — E559 Vitamin D deficiency, unspecified: Secondary | ICD-10-CM

## 2022-07-17 MED ORDER — HYDROXYZINE HCL 25 MG PO TABS: ORAL_TABLET | ORAL | 1 refills | Status: DC

## 2022-07-17 MED ORDER — LEVOTHYROXINE SODIUM 75 MCG PO TABS: 75.0000 ug | ORAL_TABLET | Freq: Every day | ORAL | 1 refills | Status: DC

## 2022-07-17 MED ORDER — VITAMIN D (ERGOCALCIFEROL) 1.25 MG (50000 UNIT) PO CAPS: ORAL_CAPSULE | ORAL | 3 refills | Status: DC

## 2022-07-17 MED ORDER — LOSARTAN POTASSIUM-HCTZ 100-25 MG PO TABS: 1.0000 | ORAL_TABLET | Freq: Every day | ORAL | 1 refills | Status: DC
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
## 2022-07-18 LAB — CBC WITH DIFFERENTIAL/PLATELET
Basophils Absolute: 0.1 10*3/uL (ref 0.0–0.2)
Basos: 1 %
EOS (ABSOLUTE): 0.1 10*3/uL (ref 0.0–0.4)
Eos: 2 %
Hematocrit: 42.7 % (ref 34.0–46.6)
Hemoglobin: 13.5 g/dL (ref 11.1–15.9)
Immature Grans (Abs): 0 10*3/uL (ref 0.0–0.1)
Immature Granulocytes: 0 %
Lymphocytes Absolute: 2.3 10*3/uL (ref 0.7–3.1)
Lymphs: 37 %
MCH: 26.1 pg — ABNORMAL LOW (ref 26.6–33.0)
MCHC: 31.6 g/dL (ref 31.5–35.7)
MCV: 82 fL (ref 79–97)
Monocytes Absolute: 0.4 10*3/uL (ref 0.1–0.9)
Monocytes: 7 %
Neutrophils Absolute: 3.2 10*3/uL (ref 1.4–7.0)
Neutrophils: 53 %
Platelets: 260 10*3/uL (ref 150–450)
RBC: 5.18 x10E6/uL (ref 3.77–5.28)
RDW: 13.1 % (ref 11.7–15.4)
WBC: 6.1 10*3/uL (ref 3.4–10.8)

## 2022-07-18 LAB — LIPID PANEL
Chol/HDL Ratio: 4 ratio (ref 0.0–4.4)
Cholesterol, Total: 201 mg/dL — ABNORMAL HIGH (ref 100–199)
HDL: 50 mg/dL (ref >39)
LDL Chol Calc (NIH): 125 mg/dL — ABNORMAL HIGH (ref 0–99)
Triglycerides: 147 mg/dL (ref 0–149)
VLDL Cholesterol Cal: 26 mg/dL (ref 5–40)

## 2022-07-18 LAB — COMPREHENSIVE METABOLIC PANEL
ALT: 37 IU/L — ABNORMAL HIGH (ref 0–32)
AST: 29 IU/L (ref 0–40)
Albumin/Globulin Ratio: 1.8 (ref 1.2–2.2)
Albumin: 4.6 g/dL (ref 3.8–4.9)
Alkaline Phosphatase: 121 IU/L (ref 44–121)
BUN/Creatinine Ratio: 22 (ref 9–23)
BUN: 13 mg/dL (ref 6–24)
Bilirubin Total: 0.6 mg/dL (ref 0.0–1.2)
CO2: 26 mmol/L (ref 20–29)
Calcium: 9.3 mg/dL (ref 8.7–10.2)
Chloride: 96 mmol/L (ref 96–106)
Creatinine, Ser: 0.59 mg/dL (ref 0.57–1.00)
Globulin, Total: 2.6 g/dL (ref 1.5–4.5)
Glucose: 92 mg/dL (ref 70–99)
Potassium: 3.9 mmol/L (ref 3.5–5.2)
Sodium: 139 mmol/L (ref 134–144)
Total Protein: 7.2 g/dL (ref 6.0–8.5)
eGFR: 108 mL/min/{1.73_m2} (ref >59)

## 2022-07-18 LAB — HEMOGLOBIN A1C
Est. average glucose Bld gHb Est-mCnc: 111 mg/dL
Hgb A1c MFr Bld: 5.5 % (ref 4.8–5.6)

## 2022-07-18 LAB — CARDIOVASCULAR RISK ASSESSMENT

## 2022-07-18 LAB — VITAMIN D 25 HYDROXY (VIT D DEFICIENCY, FRACTURES): Vit D, 25-Hydroxy: 39 ng/mL (ref 30.0–100.0)

## 2022-07-18 LAB — TSH: TSH: 1.92 u[IU]/mL (ref 0.450–4.500)

## 2022-09-21 ENCOUNTER — Ambulatory Visit: Payer: BC Managed Care – PPO | Admitting: Physician Assistant

## 2022-09-28 DIAGNOSIS — H1131 Conjunctival hemorrhage, right eye: Secondary | ICD-10-CM | POA: Diagnosis not present

## 2022-12-21 ENCOUNTER — Other Ambulatory Visit: Payer: Self-pay | Admitting: Physician Assistant

## 2022-12-21 DIAGNOSIS — F5101 Primary insomnia: Secondary | ICD-10-CM

## 2023-01-13 ENCOUNTER — Other Ambulatory Visit: Payer: Self-pay | Admitting: Physician Assistant

## 2023-01-13 DIAGNOSIS — I10 Essential (primary) hypertension: Secondary | ICD-10-CM

## 2023-01-13 NOTE — Telephone Encounter (Signed)
Pt due for fasting follow up - please call and schedule and will fill med after appt made Can be in June

## 2023-01-13 NOTE — Telephone Encounter (Signed)
Left message for patient to call office and schedule appointment. 

## 2023-01-13 NOTE — Telephone Encounter (Signed)
Pt overdue for fasting follow up - please schedule

## 2023-01-21 ENCOUNTER — Other Ambulatory Visit: Payer: Self-pay | Admitting: Physician Assistant

## 2023-01-21 DIAGNOSIS — E039 Hypothyroidism, unspecified: Secondary | ICD-10-CM

## 2023-02-09 ENCOUNTER — Other Ambulatory Visit: Payer: Self-pay

## 2023-02-09 ENCOUNTER — Other Ambulatory Visit: Payer: Self-pay | Admitting: Obstetrics and Gynecology

## 2023-02-09 DIAGNOSIS — Z1231 Encounter for screening mammogram for malignant neoplasm of breast: Secondary | ICD-10-CM

## 2023-02-09 DIAGNOSIS — I10 Essential (primary) hypertension: Secondary | ICD-10-CM

## 2023-02-09 MED ORDER — LOSARTAN POTASSIUM-HCTZ 100-25 MG PO TABS: 1.0000 | ORAL_TABLET | Freq: Every day | ORAL | 0 refills | Status: DC

## 2023-02-24 ENCOUNTER — Ambulatory Visit: Payer: BC Managed Care – PPO | Admitting: Physician Assistant

## 2023-02-24 ENCOUNTER — Encounter: Payer: Self-pay | Admitting: Physician Assistant

## 2023-02-24 VITALS — BP 130/80 | HR 84 | Temp 97.2°F | Ht 63.0 in | Wt 221.8 lb

## 2023-02-24 DIAGNOSIS — E782 Mixed hyperlipidemia: Secondary | ICD-10-CM

## 2023-02-24 DIAGNOSIS — E039 Hypothyroidism, unspecified: Secondary | ICD-10-CM | POA: Diagnosis not present

## 2023-02-24 DIAGNOSIS — R002 Palpitations: Secondary | ICD-10-CM | POA: Diagnosis not present

## 2023-02-24 DIAGNOSIS — Z23 Encounter for immunization: Secondary | ICD-10-CM | POA: Diagnosis not present

## 2023-02-24 DIAGNOSIS — I10 Essential (primary) hypertension: Secondary | ICD-10-CM

## 2023-02-24 DIAGNOSIS — R739 Hyperglycemia, unspecified: Secondary | ICD-10-CM

## 2023-02-24 DIAGNOSIS — E559 Vitamin D deficiency, unspecified: Secondary | ICD-10-CM | POA: Diagnosis not present
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
## 2023-02-25 LAB — LIPID PANEL
Chol/HDL Ratio: 3.8 ratio (ref 0.0–4.4)
Cholesterol, Total: 226 mg/dL — ABNORMAL HIGH (ref 100–199)
HDL: 60 mg/dL (ref >39)
LDL Chol Calc (NIH): 139 mg/dL — ABNORMAL HIGH (ref 0–99)
Triglycerides: 152 mg/dL — ABNORMAL HIGH (ref 0–149)
VLDL Cholesterol Cal: 27 mg/dL (ref 5–40)

## 2023-02-25 LAB — COMPREHENSIVE METABOLIC PANEL
ALT: 27 IU/L (ref 0–32)
AST: 23 IU/L (ref 0–40)
Albumin: 4.8 g/dL (ref 3.8–4.9)
Alkaline Phosphatase: 127 IU/L — ABNORMAL HIGH (ref 44–121)
BUN/Creatinine Ratio: 31 — ABNORMAL HIGH (ref 9–23)
BUN: 17 mg/dL (ref 6–24)
Bilirubin Total: 0.4 mg/dL (ref 0.0–1.2)
CO2: 27 mmol/L (ref 20–29)
Calcium: 9.7 mg/dL (ref 8.7–10.2)
Chloride: 96 mmol/L (ref 96–106)
Creatinine, Ser: 0.55 mg/dL — ABNORMAL LOW (ref 0.57–1.00)
Globulin, Total: 2.6 g/dL (ref 1.5–4.5)
Glucose: 66 mg/dL — ABNORMAL LOW (ref 70–99)
Potassium: 4.3 mmol/L (ref 3.5–5.2)
Sodium: 140 mmol/L (ref 134–144)
Total Protein: 7.4 g/dL (ref 6.0–8.5)
eGFR: 109 mL/min/{1.73_m2} (ref >59)

## 2023-02-25 LAB — CBC WITH DIFFERENTIAL/PLATELET
Basophils Absolute: 0 10*3/uL (ref 0.0–0.2)
Basos: 1 %
EOS (ABSOLUTE): 0.1 10*3/uL (ref 0.0–0.4)
Eos: 2 %
Hematocrit: 43.9 % (ref 34.0–46.6)
Hemoglobin: 14.3 g/dL (ref 11.1–15.9)
Immature Grans (Abs): 0 10*3/uL (ref 0.0–0.1)
Immature Granulocytes: 1 %
Lymphocytes Absolute: 2.5 10*3/uL (ref 0.7–3.1)
Lymphs: 28 %
MCH: 26.6 pg (ref 26.6–33.0)
MCHC: 32.6 g/dL (ref 31.5–35.7)
MCV: 82 fL (ref 79–97)
Monocytes Absolute: 0.4 10*3/uL (ref 0.1–0.9)
Monocytes: 5 %
Neutrophils Absolute: 5.7 10*3/uL (ref 1.4–7.0)
Neutrophils: 63 %
Platelets: 286 10*3/uL (ref 150–450)
RBC: 5.38 x10E6/uL — ABNORMAL HIGH (ref 3.77–5.28)
RDW: 13.4 % (ref 11.7–15.4)
WBC: 8.8 10*3/uL (ref 3.4–10.8)

## 2023-02-25 LAB — VITAMIN D 25 HYDROXY (VIT D DEFICIENCY, FRACTURES): Vit D, 25-Hydroxy: 39.4 ng/mL (ref 30.0–100.0)

## 2023-02-25 LAB — TSH: TSH: 2.94 u[IU]/mL (ref 0.450–4.500)

## 2023-02-25 LAB — T4, FREE: Free T4: 1.25 ng/dL (ref 0.82–1.77)

## 2023-02-25 LAB — HEMOGLOBIN A1C
Est. average glucose Bld gHb Est-mCnc: 117 mg/dL
Hgb A1c MFr Bld: 5.7 % — ABNORMAL HIGH (ref 4.8–5.6)

## 2023-02-26 ENCOUNTER — Ambulatory Visit
Admission: RE | Admit: 2023-02-26 | Discharge: 2023-02-26 | Disposition: A | Discharge status: Home / Self Care | Payer: BC Managed Care – PPO | Source: Ambulatory Visit | Attending: Obstetrics and Gynecology | Admitting: Obstetrics and Gynecology

## 2023-02-26 DIAGNOSIS — Z1231 Encounter for screening mammogram for malignant neoplasm of breast: Secondary | ICD-10-CM

## 2023-03-05 ENCOUNTER — Other Ambulatory Visit: Payer: Self-pay | Admitting: Physician Assistant

## 2023-03-05 DIAGNOSIS — I10 Essential (primary) hypertension: Secondary | ICD-10-CM

## 2023-03-10 ENCOUNTER — Telehealth: Payer: Self-pay

## 2023-03-10 ENCOUNTER — Other Ambulatory Visit: Payer: Self-pay | Admitting: Physician Assistant

## 2023-03-10 ENCOUNTER — Encounter: Payer: Self-pay | Admitting: Physician Assistant

## 2023-03-10 DIAGNOSIS — B029 Zoster without complications: Secondary | ICD-10-CM

## 2023-03-10 MED ORDER — PREDNISONE 20 MG PO TABS
ORAL_TABLET | ORAL | 0 refills | Status: DC
Start: 2023-03-10 — End: 2023-04-20

## 2023-03-10 MED ORDER — VALACYCLOVIR HCL 1 G PO TABS
1000.0000 mg | ORAL_TABLET | Freq: Three times a day (TID) | ORAL | 0 refills | Status: DC
Start: 2023-03-10 — End: 2023-04-20

## 2023-03-10 NOTE — Telephone Encounter (Signed)
Slight redness/knot around injection site can be normal for shingrix vaccine Would have to see rash to know if it is perhaps shingles - usually this injection does not cause acute outbreak of shingles

## 2023-03-10 NOTE — Telephone Encounter (Signed)
Patient stated she will send a pictures right now through my chart.

## 2023-03-10 NOTE — Telephone Encounter (Signed)
Patient called and stated she got the shingrix at her last visit, stated she started having some pain under her right arm on Wednesday and today notice she had 5 or 6 blister under right arm. Wanted to know could this be shingles and is it contagious. Also stated that she had an reaction to the shot her arm was swollen and had fever to it with a knot and it took a week before it went away. Please advise

## 2023-03-10 NOTE — Telephone Encounter (Signed)
Probable shingles --- pt states it is painful and burning -- rx sent for valtrex and prednisone

## 2023-03-22 ENCOUNTER — Other Ambulatory Visit: Payer: Self-pay | Admitting: Physician Assistant

## 2023-03-22 DIAGNOSIS — F5101 Primary insomnia: Secondary | ICD-10-CM

## 2023-04-19 DIAGNOSIS — Z01419 Encounter for gynecological examination (general) (routine) without abnormal findings: Secondary | ICD-10-CM | POA: Diagnosis not present

## 2023-04-20 ENCOUNTER — Ambulatory Visit: Payer: BC Managed Care – PPO | Attending: Cardiovascular Disease

## 2023-04-20 ENCOUNTER — Encounter: Payer: Self-pay | Admitting: Cardiovascular Disease

## 2023-04-20 ENCOUNTER — Ambulatory Visit: Payer: BC Managed Care – PPO | Attending: Cardiovascular Disease | Admitting: Cardiovascular Disease

## 2023-04-20 VITALS — BP 118/84 | HR 84 | Ht 63.0 in | Wt 223.6 lb

## 2023-04-20 DIAGNOSIS — R002 Palpitations: Secondary | ICD-10-CM

## 2023-04-20 DIAGNOSIS — R011 Cardiac murmur, unspecified: Secondary | ICD-10-CM | POA: Diagnosis not present

## 2023-04-20 DIAGNOSIS — I10 Essential (primary) hypertension: Secondary | ICD-10-CM

## 2023-04-20 NOTE — Patient Instructions (Signed)
Medication Instructions:  No changes *If you need a refill on your cardiac medications before your next appointment, please call your pharmacy*  Testing/Procedures: Your physician has requested that you have an echocardiogram. Echocardiography is a painless test that uses sound waves to create images of your heart. It provides your doctor with information about the size and shape of your heart and how well your heart's chambers and valves are working. This procedure takes approximately one hour. There are no restrictions for this procedure. Please do NOT wear cologne, perfume, aftershave, or lotions (deodorant is allowed). Please arrive 15 minutes prior to your appointment time.    Dr Royann Shivers has ordered a CT coronary calcium score.   Test locations:  [facility]  [place] at [facility]  This is $99 out of pocket.   Coronary CalciumScan A coronary calcium scan is an imaging test used to look for deposits of calcium and other fatty materials (plaques) in the inner lining of the blood vessels of the heart (coronary arteries). These deposits of calcium and plaques can partly clog and narrow the coronary arteries without producing any symptoms or warning signs. This puts a person at risk for a heart attack. This test can detect these deposits before symptoms develop. Tell a health care provider about: Any allergies you have. All medicines you are taking, including vitamins, herbs, eye drops, creams, and over-the-counter medicines. Any problems you or family members have had with anesthetic medicines. Any blood disorders you have. Any surgeries you have had. Any medical conditions you have. Whether you are pregnant or may be pregnant. What are the risks? Generally, this is a safe procedure. However, problems may occur, including: Harm to a pregnant woman and her unborn baby. This test involves the use of  radiation. Radiation exposure can be dangerous to a pregnant woman and her unborn baby. If you are pregnant, you generally should not have this procedure done. Slight increase in the risk of cancer. This is because of the radiation involved in the test. What happens before the procedure? No preparation is needed for this procedure. What happens during the procedure? You will undress and remove any jewelry around your neck or chest. You will put on a hospital gown. Sticky electrodes will be placed on your chest. The electrodes will be connected to an electrocardiogram (ECG) machine to record a tracing of the electrical activity of your heart. A CT scanner will take pictures of your heart. During this time, you will be asked to lie still and hold your breath for 2-3 seconds while a picture of your heart is being taken. The procedure may vary among health care providers and hospitals. What happens after the procedure? You can get dressed. You can return to your normal activities. It is up to you to get the results of your test. Ask your health care provider, or the department that is doing the test, when your results will be ready. Summary A coronary calcium scan is an imaging test used to look for deposits of calcium and other fatty materials (plaques) in the inner lining of the blood vessels of the heart (coronary arteries). Generally, this is a safe procedure. Tell your health care provider if you are pregnant or may be pregnant. No preparation is needed for this procedure. A CT scanner will take pictures of your heart. You can return to your normal activities after the scan is done. This information is not intended to  replace advice given to you by your health care provider. Make sure you discuss any questions you have with your health care provider. Document Released: 02/20/2008 Document Revised: 07/13/2016 Document Reviewed: 07/13/2016 Elsevier Interactive Patient Education  2017 [employer].   Your physician has recommended that you wear a 3 DAY ZIO-PATCH monitor. The Zio patch cardiac monitor continuously records heart rhythm data for up to 14 days, this is for patients being evaluated for multiple types heart rhythms. For the first 24 hours post application, please avoid getting the Zio monitor wet in the shower or by excessive sweating during exercise. After that, feel free to carry on with regular activities. Keep soaps and lotions away from the ZIO XT Patch.  This will be mailed to you, please expect 7-10 days to receive.    Applying the monitor   Shave hair from upper left chest.   Hold abrader disc by orange tab.  Rub abrader in 40 strokes over left upper chest as indicated in your monitor instructions.   Clean area with 4 enclosed alcohol pads .  Use all pads to assure are is cleaned thoroughly.  Let dry.   Apply patch as indicated in monitor instructions.  Patch will be place under collarbone on left side of chest with arrow pointing upward.   Rub patch adhesive wings for 2 minutes.Remove white label marked "1".  Remove white label marked "2".  Rub patch adhesive wings for 2 additional minutes.   While looking in a mirror, press and release button in center of patch.  A small green light will flash 3-4 times .  This will be your only indicator the monitor has been turned on.     Do not shower for the first 24 hours.  You may shower after the first 24 hours.   Press button if you feel a symptom. You will hear a small click.  Record Date, Time and Symptom in the Patient Log Book.   When you are ready to remove patch, follow instructions on last 2 pages of Patient Log Book.  Stick patch monitor onto last page of Patient Log Book.   Place Patient Log Book in [place] box.  Use locking tab on box and tape box closed securely.  The [employer] and [employer] has [employer] on it.  Please place in mailbox as soon as possible.  Your physician should have your test results  approximately 7 days after the monitor has been mailed back to [facility].   Call [facility] Customer Care at [phone] if you have questions regarding your ZIO XT patch monitor.  Call them immediately if you see an orange light blinking on your monitor.   If your monitor falls off in less than 4 days contact our Monitor department at [phone].  If your monitor becomes loose or falls off after 4 days call [employer] at [phone] for suggestions on securing your monitor   You can look into the [facility] device by [employer]. This device is purchased by you and it connects to an application you download to your smart phone.  It can detect abnormal heart rhythms and alert you to contact your doctor for further evaluation. The web site is:  https://www.alivecor.com     Follow-Up: At [facility], you and your health needs are our priority.  As part of our continuing mission to provide you with exceptional heart care, we have created designated Provider Care Teams.  These Care Teams include your primary Cardiologist (physician)  and Advanced Practice Providers (APPs -  Physician Assistants and Nurse Practitioners) who all work together to provide you with the care you need, when you need it.  We recommend signing up for the patient portal called "MyChart".  Sign up information is provided on this After Visit Summary.  MyChart is used to connect with patients for Virtual Visits (Telemedicine).  Patients are able to view lab/test results, encounter notes, upcoming appointments, etc.  Non-urgent messages can be sent to your provider as well.   To learn more about what you can do with MyChart, go to [email or web address].    Your next appointment:   1 year(s)  Provider:   Dr Royann Shivers

## 2023-04-20 NOTE — Progress Notes (Signed)
Cardiology Office Note:  .   Date:  04/20/2023  ID:  Vanessa Ralphs, DOB 1971-03-05, MRN [id number] PCP: Marianne Sofia, PA-C  [facility] Providers Cardiologist:  None    History of Present Illness: .   Brandy Jimenez is a 52 y.o. female with essential hypertension treated hypothyroidism referred in consultation by Fidela Juneau, PA-C due to complaints of palpitations.  Starting in May-June she started having complaints of palpitations described as "skipping beats" occurring at rest or with minimal activity lasting for anywhere from a few minutes to 15 minutes and not associated with chest pain, shortness of breath or syncope.  The palpitations do not occur during regular activity (she walks 10 miles a day and 2.5 hours 5 days a week and also occasionally lifts weights).  She confirmed the irregular rhythm with a pulse oximeter, but never obtained an electrical tracing.  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   She was told years ago by her OB that she had a murmur, but her murmur has not been detected more recently.  Her father died of what sounds like sudden arrhythmic cardiac arrest at age 82.  He had had several coronary stents, before a diagnosis of congestive heart failure, had a pacemaker in his late 34s later upgraded to an ICD.  Although he was successfully resuscitated, he never recovered from the complications of the cardiac arrest.  Her mother has been diagnosed with atrial fibrillation and has failed cardioversion, sounds like she now has permanent atrial fibrillation, managed with rate control and asymptomatic.  Her maternal grandmother died suddenly in her late 26s from "V-fib".  She does not smoke.  Recent labs show borderline hemoglobin A1c of 5.7% and mildly elevated LDL of 139, but with a good HDL at 139.  She underwent total abdominal hysterectomy and bilateral  salpingo-oophorectomy following an endometrial polyp diagnosis roughly 1 year ago.  She has well treated hypertension.  Recent TSH was in normal range.  ROS: The patient specifically denies any chest pain at rest exertion, dyspnea at rest or with exertion, orthopnea, paroxysmal nocturnal dyspnea, syncope, focal neurological deficits, intermittent claudication, lower extremity edema, unexplained weight gain, cough, hemoptysis or wheezing.   Studies Reviewed: Marland Kitchen   EKG Interpretation Date/Time:  Tuesday April 20 2023 08:59:16 EDT Ventricular Rate:  84 PR Interval:  152 QRS Duration:  88 QT Interval:  370 QTC Calculation: 437 R Axis:   36  Text Interpretation: Normal sinus rhythm Normal ECG When compared with ECG of 21-Apr-2022 11:32, Minimal criteria for Anterior infarct are no longer Present No significant change was found Confirmed by Destinie Thornsberry [phone]) on 04/20/2023 9:35:57 AM    ECG 02/24/2023 shows normal sinus rhythm  Labs of 02/24/2023 showed cholesterol 226, HDL 60, LDL 139, triglycerides 152, hemoglobin A1c 5.7% Creatinine 0.55, potassium 4.3, ALT 27, TSH 2.94, hemoglobin 14.3 Risk Assessment/Calculations:             Physical Exam:   VS:  BP 118/84 (BP Location: Left Arm, Patient Position: Sitting, Cuff Size: Large)   Pulse 84   Ht 5\' 3"  (1.6 m)   Wt 223 lb 9.6 oz (101.4 kg)   LMP 06/28/2015   SpO2 96%   BMI 39.61 kg/m    Wt Readings from Last 3 Encounters:  04/20/23 223 lb 9.6 oz (101.4 kg)  02/24/23 221 lb 12.8 oz (100.6 kg)  07/17/22 232  lb 9.6 oz (105.5 kg)    GEN: Well nourished, well developed in no acute distress NECK: No JVD; No carotid bruits CARDIAC: Acuter rate and rhythm, normal S1 and S2, 1/6 early peaking aortic ejection murmur, no diastolic murmurs, rubs, gallops.  All distal pulses are normal. RESPIRATORY:  Clear to auscultation without rales, wheezing or rhonchi  ABDOMEN: Soft, non-tender, non-distended EXTREMITIES:  No edema; No deformity    ASSESSMENT AND PLAN: .   Palpitations: The description sounds like isolated PACs and PVCs and she does not have any other symptoms that would suggest high risk, but she has 2 family members with arrhythmic death.  While her father had a long previous history of structural heart disease, her maternal grandmother died suddenly at a young age.  ECG is normal, QTc is completely within normal range.  Will have her wear an arrhythmia monitor, but also encouraged her to get a personal electrical monitoring device such as a smart watch or Kardia. Dyslipidemia: She has borderline elevated triglycerides and moderately elevated LDL cholesterol, as well as borderline hemoglobin A1c.  All these values suggest insulin resistance/metabolic syndrome.  She is well aware of the fact that she needs to lose weight and plans to eat much healthier now that she has retired.  I advised a coronary calcium score, in view of her family history of CAD.  If her calcium score is 0 or very low, would recommend accelerated efforts at weight loss, healthy lifestyle changes and continued physical exercise, with a repeat lipid profile in 6 months (target LDL less than 100).  However if her calcium score is high I would recommend starting statin therapy now, with a target LDL less than 70.  Either way it is important to eat a healthy diet consists lower in sugars and starches with high glycemic index, lower in saturated fats (beef, pork, dairy, etc.); increase lean protein and unsaturated fat from nuts, oils, fish, etc.), complex carbohydrates with low glycemic index. Murmur: Sounds like aortic valve sclerosis.  Will get an echocardiogram to confirm that there is no serious valvular pathology that could explain her palpitations. HTN: well-controlled.  Labs with normal electrolyte levels recently. Hypothyroidism: Well compensated on current levothyroxine dose.       Dispo: Echocardiogram, coronary calcium score, 3-day arrhythmia monitor,  repeat lipid profile in 6 months, follow-up in 1 year unless we find serious abnormalities on workup.  Signed, Thurmon Fair, MD

## 2023-04-20 NOTE — Progress Notes (Unsigned)
Enrolled patient for a 3 day Zio XT monitor to be mailed to patients home  

## 2023-05-04 DIAGNOSIS — R002 Palpitations: Secondary | ICD-10-CM | POA: Diagnosis not present

## 2023-05-18 ENCOUNTER — Telehealth: Payer: Self-pay | Admitting: Emergency Medicine

## 2023-05-18 MED ORDER — CARVEDILOL 6.25 MG PO TABS: 6.2500 mg | ORAL_TABLET | Freq: Two times a day (BID) | ORAL | 3 refills | Status: DC

## 2023-05-18 MED ORDER — CARVEDILOL 6.25 MG PO TABS: 6.2500 mg | ORAL_TABLET | Freq: Two times a day (BID) | ORAL | 0 refills | Status: DC

## 2023-05-18 NOTE — Telephone Encounter (Signed)
Called and left a message with call back number- stating that I was going to go ahead and send in the prescription. (Pt has already seen the results/message below in MyChart)  The extra beats from the bottom chamber the heart are quite frequent, representing almost 20% of all recorded beats.  There is no evidence of any severe arrhythmia, but this year frequency of the PVCs is of interest.  The echocardiogram will go a long way to reassure [place] that these PVCs represent simply a nuisance or a sign of some underlying structural heart problem. I would like to make a change to the blood pressure medications, replacing the losartan-hydrochlorothiazide with a different agent that can help suppress the PVCs. Please start carvedilol 6.25 mg twice daily instead. Please keep a log of BP over the next 2 weeks (it will take that long for the full effect of the change in meds   Losartan-hydrochlorothiazide taken off of pt's list  Sent in prescription for Carvedilol- [facility] to [facility] [place]  And 90 day supply with 3 refills to [facility]  Sent a MyChart message to patient as well

## 2023-05-27 ENCOUNTER — Ambulatory Visit: Payer: BC Managed Care – PPO

## 2023-05-31 ENCOUNTER — Ambulatory Visit (HOSPITAL_BASED_OUTPATIENT_CLINIC_OR_DEPARTMENT_OTHER)
Admission: RE | Admit: 2023-05-31 | Discharge: 2023-05-31 | Disposition: A | Discharge status: Home / Self Care | Payer: BC Managed Care – PPO | Source: Ambulatory Visit | Attending: Cardiovascular Disease | Admitting: Cardiovascular Disease

## 2023-05-31 ENCOUNTER — Ambulatory Visit (HOSPITAL_BASED_OUTPATIENT_CLINIC_OR_DEPARTMENT_OTHER): Payer: BC Managed Care – PPO

## 2023-05-31 ENCOUNTER — Other Ambulatory Visit (HOSPITAL_BASED_OUTPATIENT_CLINIC_OR_DEPARTMENT_OTHER): Payer: Self-pay | Admitting: Cardiovascular Disease

## 2023-05-31 ENCOUNTER — Telehealth: Payer: Self-pay | Admitting: *Deleted

## 2023-05-31 DIAGNOSIS — R931 Abnormal findings on diagnostic imaging of heart and coronary circulation: Secondary | ICD-10-CM

## 2023-05-31 DIAGNOSIS — R011 Cardiac murmur, unspecified: Secondary | ICD-10-CM | POA: Diagnosis not present

## 2023-05-31 DIAGNOSIS — I10 Essential (primary) hypertension: Secondary | ICD-10-CM | POA: Insufficient documentation

## 2023-05-31 LAB — ECHOCARDIOGRAM COMPLETE
Area-P 1/2: 3.72 cm2
MV M vel: 3.89 m/s
MV Peak grad: 60.5 mmHg
S' Lateral: 2.27 cm

## 2023-05-31 MED ORDER — CARVEDILOL 12.5 MG PO TABS
12.5000 mg | ORAL_TABLET | Freq: Two times a day (BID) | ORAL | 3 refills | Status: DC
Start: 2023-05-31 — End: 2023-08-27

## 2023-05-31 MED ORDER — ROSUVASTATIN CALCIUM 20 MG PO TABS: 20.0000 mg | ORAL_TABLET | Freq: Every day | ORAL | 3 refills | Status: DC

## 2023-05-31 NOTE — Telephone Encounter (Signed)
New script sent to the pharmacy for carvedilol. Lab orders mailed to the pt

## 2023-05-31 NOTE — Telephone Encounter (Signed)
-----   Message from [facility] sent at 05/31/2023  4:01 PM EDT ----- The coronary calcium score is remarkably high for age and gender.  In view of the strong family history of coronary problems and her lipid profile abnormalities I do recommend taking medications to bring the cholesterol lower.  Target an LDL cholesterol level less than 70 (this means a 50% reduction, which is rarely achievable just with lifestyle changes). Recommend starting rosuvastatin 20 mg daily and checking a repeat lipid profile in 3 months. I also recommend a plain ECG treadmill stress test (order and attestation in). I discussed all these findings with her by phone today.  While we talk she told me that the current treatment for her hypertension (carvedilol 6.25 mg twice daily) is not working as well as her previous medication.  Her blood pressures running in the 140s/90s.  I asked her to increase the carvedilol to 12.5 mg twice daily (I did not send in a new prescription just asked her to double up on the tablets she already has).  If this remains insufficient, we will rethink our blood pressure control strategy.  Asked her to check back in with [place] with some blood pressure readings later this week. Please make sure to tell her not to take the carvedilol on the day of the treadmill stress test.

## 2023-06-03 ENCOUNTER — Telehealth: Payer: Self-pay | Admitting: Emergency Medicine

## 2023-06-03 NOTE — Telephone Encounter (Signed)
Called to go over instructions with the patient. No answer, left message stating that I would leave information in a MyChart message. Call back number given                    Patient Instructions for Stress Test  Medication instructions:   Do NOT take your Carvedilol on the morning of the procedure.  Do not eat, drink or use tobacco products four hours prior to the test.  Water is ok.  3.  Dress prepared to exercise in a comfortable, two piece clothing outfit and walking shoes.  4.  Bring any current prescription medications with you the day of the test.  5.  Notify the office 24 hours in advance if you cannot keep this appointment.  6.  If you have any questions, please call [phone].   Will forward to scheduling to get ETT scheduled.

## 2023-06-05 ENCOUNTER — Other Ambulatory Visit: Payer: Self-pay | Admitting: Physician Assistant

## 2023-06-05 DIAGNOSIS — I10 Essential (primary) hypertension: Secondary | ICD-10-CM

## 2023-06-16 NOTE — Telephone Encounter (Signed)
Left message and Call back number: Following up to see if anyone has called to schedule the Exercise Treadmill Test (ETT)  (Order already placed and instructions sent over Mychart and in previous documentation)

## 2023-06-29 ENCOUNTER — Ambulatory Visit: Payer: BC Managed Care – PPO | Attending: Cardiovascular Disease

## 2023-06-29 DIAGNOSIS — R931 Abnormal findings on diagnostic imaging of heart and coronary circulation: Secondary | ICD-10-CM

## 2023-06-29 LAB — EXERCISE TOLERANCE TEST
Estimated workload: 10.1
Exercise duration (min): 8 min
Exercise duration (sec): 0 s
MPHR: 166 {beats}/min
Peak HR: 142 {beats}/min
Percent HR: 85 %
RPE: 15
Rest HR: 82 {beats}/min
ST Depression (mm): 0 mm

## 2023-07-01 DIAGNOSIS — H35341 Macular cyst, hole, or pseudohole, right eye: Secondary | ICD-10-CM | POA: Diagnosis not present

## 2023-07-02 DIAGNOSIS — H35341 Macular cyst, hole, or pseudohole, right eye: Secondary | ICD-10-CM | POA: Diagnosis not present

## 2023-07-02 DIAGNOSIS — H43822 Vitreomacular adhesion, left eye: Secondary | ICD-10-CM | POA: Diagnosis not present

## 2023-07-02 DIAGNOSIS — H2513 Age-related nuclear cataract, bilateral: Secondary | ICD-10-CM | POA: Diagnosis not present

## 2023-07-08 DIAGNOSIS — H35341 Macular cyst, hole, or pseudohole, right eye: Secondary | ICD-10-CM | POA: Diagnosis not present

## 2023-07-08 HISTORY — PX: PARS PLANA VITRECTOMY W/ REPAIR OF MACULAR HOLE: SHX2170

## 2023-07-16 DIAGNOSIS — H43822 Vitreomacular adhesion, left eye: Secondary | ICD-10-CM | POA: Diagnosis not present

## 2023-07-20 ENCOUNTER — Other Ambulatory Visit: Payer: Self-pay | Admitting: Family Medicine

## 2023-07-20 DIAGNOSIS — E039 Hypothyroidism, unspecified: Secondary | ICD-10-CM

## 2023-08-02 ENCOUNTER — Other Ambulatory Visit: Payer: Self-pay | Admitting: Physician Assistant

## 2023-08-02 DIAGNOSIS — E559 Vitamin D deficiency, unspecified: Secondary | ICD-10-CM

## 2023-08-11 DIAGNOSIS — Z9889 Other specified postprocedural states: Secondary | ICD-10-CM | POA: Diagnosis not present

## 2023-08-11 DIAGNOSIS — H35341 Macular cyst, hole, or pseudohole, right eye: Secondary | ICD-10-CM | POA: Diagnosis not present

## 2023-08-27 ENCOUNTER — Encounter: Payer: Self-pay | Admitting: Physician Assistant

## 2023-08-27 ENCOUNTER — Ambulatory Visit: Payer: BC Managed Care – PPO | Admitting: Physician Assistant

## 2023-08-27 VITALS — BP 150/86 | HR 63 | Temp 97.2°F | Resp 14 | Ht 63.0 in | Wt 230.0 lb

## 2023-08-27 DIAGNOSIS — E559 Vitamin D deficiency, unspecified: Secondary | ICD-10-CM | POA: Diagnosis not present

## 2023-08-27 DIAGNOSIS — E782 Mixed hyperlipidemia: Secondary | ICD-10-CM

## 2023-08-27 DIAGNOSIS — E039 Hypothyroidism, unspecified: Secondary | ICD-10-CM

## 2023-08-27 DIAGNOSIS — I10 Essential (primary) hypertension: Secondary | ICD-10-CM | POA: Diagnosis not present

## 2023-08-27 DIAGNOSIS — R739 Hyperglycemia, unspecified: Secondary | ICD-10-CM

## 2023-08-27 DIAGNOSIS — Z23 Encounter for immunization: Secondary | ICD-10-CM

## 2023-08-27 MED ORDER — CARVEDILOL 25 MG PO TABS
25.0000 mg | ORAL_TABLET | Freq: Two times a day (BID) | ORAL | 1 refills | Status: DC
Start: 2023-08-27 — End: 2023-11-04

## 2023-08-27 MED ORDER — ROSUVASTATIN CALCIUM 20 MG PO TABS
20.0000 mg | ORAL_TABLET | Freq: Every day | ORAL | 1 refills | Status: DC
Start: 2023-08-27 — End: 2023-11-25
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
## 2023-08-28 LAB — CBC WITH DIFFERENTIAL/PLATELET
Basophils Absolute: 0 10*3/uL (ref 0.0–0.2)
Basos: 1 %
EOS (ABSOLUTE): 0.1 10*3/uL (ref 0.0–0.4)
Eos: 1 %
Hematocrit: 43.5 % (ref 34.0–46.6)
Hemoglobin: 14.4 g/dL (ref 11.1–15.9)
Immature Grans (Abs): 0 10*3/uL (ref 0.0–0.1)
Immature Granulocytes: 0 %
Lymphocytes Absolute: 2.2 10*3/uL (ref 0.7–3.1)
Lymphs: 33 %
MCH: 27.9 pg (ref 26.6–33.0)
MCHC: 33.1 g/dL (ref 31.5–35.7)
MCV: 84 fL (ref 79–97)
Monocytes Absolute: 0.3 10*3/uL (ref 0.1–0.9)
Monocytes: 5 %
Neutrophils Absolute: 4 10*3/uL (ref 1.4–7.0)
Neutrophils: 60 %
Platelets: 233 10*3/uL (ref 150–450)
RBC: 5.16 x10E6/uL (ref 3.77–5.28)
RDW: 12.5 % (ref 11.7–15.4)
WBC: 6.7 10*3/uL (ref 3.4–10.8)

## 2023-08-28 LAB — COMPREHENSIVE METABOLIC PANEL
ALT: 25 [IU]/L (ref 0–32)
AST: 21 [IU]/L (ref 0–40)
Albumin: 4.7 g/dL (ref 3.8–4.9)
Alkaline Phosphatase: 105 [IU]/L (ref 44–121)
BUN/Creatinine Ratio: 31 — ABNORMAL HIGH (ref 9–23)
BUN: 15 mg/dL (ref 6–24)
Bilirubin Total: 0.5 mg/dL (ref 0.0–1.2)
CO2: 25 mmol/L (ref 20–29)
Calcium: 9.1 mg/dL (ref 8.7–10.2)
Chloride: 101 mmol/L (ref 96–106)
Creatinine, Ser: 0.48 mg/dL — ABNORMAL LOW (ref 0.57–1.00)
Globulin, Total: 2.8 g/dL (ref 1.5–4.5)
Glucose: 88 mg/dL (ref 70–99)
Potassium: 4.8 mmol/L (ref 3.5–5.2)
Sodium: 141 mmol/L (ref 134–144)
Total Protein: 7.5 g/dL (ref 6.0–8.5)
eGFR: 112 mL/min/{1.73_m2} (ref >59)

## 2023-08-28 LAB — LIPID PANEL
Chol/HDL Ratio: 2.8 {ratio} (ref 0.0–4.4)
Cholesterol, Total: 155 mg/dL (ref 100–199)
HDL: 56 mg/dL (ref >39)
LDL Chol Calc (NIH): 78 mg/dL (ref 0–99)
Triglycerides: 120 mg/dL (ref 0–149)
VLDL Cholesterol Cal: 21 mg/dL (ref 5–40)

## 2023-08-28 LAB — TSH: TSH: 3.43 u[IU]/mL (ref 0.450–4.500)

## 2023-08-28 LAB — HEMOGLOBIN A1C
Est. average glucose Bld gHb Est-mCnc: 111 mg/dL
Hgb A1c MFr Bld: 5.5 % (ref 4.8–5.6)

## 2023-08-28 LAB — VITAMIN D 25 HYDROXY (VIT D DEFICIENCY, FRACTURES): Vit D, 25-Hydroxy: 50.1 ng/mL (ref 30.0–100.0)

## 2023-08-29 ENCOUNTER — Other Ambulatory Visit: Payer: Self-pay | Admitting: Physician Assistant

## 2023-08-29 DIAGNOSIS — E782 Mixed hyperlipidemia: Secondary | ICD-10-CM

## 2023-08-29 MED ORDER — ROSUVASTATIN CALCIUM 20 MG PO TABS: 20.0000 mg | ORAL_TABLET | Freq: Every day | ORAL | 1 refills | Status: DC

## 2023-09-20 ENCOUNTER — Other Ambulatory Visit: Payer: Self-pay | Admitting: Physician Assistant

## 2023-09-20 DIAGNOSIS — F5101 Primary insomnia: Secondary | ICD-10-CM

## 2023-09-24 DIAGNOSIS — D3131 Benign neoplasm of right choroid: Secondary | ICD-10-CM | POA: Diagnosis not present

## 2023-10-01 ENCOUNTER — Ambulatory Visit: Payer: BC Managed Care – PPO

## 2023-10-08 ENCOUNTER — Other Ambulatory Visit: Payer: Self-pay | Admitting: Physician Assistant

## 2023-10-08 ENCOUNTER — Ambulatory Visit (INDEPENDENT_AMBULATORY_CARE_PROVIDER_SITE_OTHER): Payer: 59

## 2023-10-08 VITALS — BP 138/70

## 2023-10-08 DIAGNOSIS — Z23 Encounter for immunization: Secondary | ICD-10-CM | POA: Diagnosis not present

## 2023-10-08 DIAGNOSIS — I10 Essential (primary) hypertension: Secondary | ICD-10-CM

## 2023-10-08 MED ORDER — HYDROCHLOROTHIAZIDE 25 MG PO TABS
25.0000 mg | ORAL_TABLET | Freq: Every day | ORAL | 2 refills | Status: DC
Start: 2023-10-08 — End: 2024-01-05

## 2023-10-08 NOTE — Progress Notes (Signed)
      Patient: Brandy Jimenez  DOB: [birth year]  MRN: [id number]    Visit Date: 10/08/2023    Brandy Jimenez presents today for her  Shingrix  vaccine.  Patient tolerated the injection well and has no questions.       Jacklynn Bue, LPN  40/98/11 9:14 AM

## 2023-10-18 ENCOUNTER — Other Ambulatory Visit: Payer: Self-pay | Admitting: Physician Assistant

## 2023-10-18 DIAGNOSIS — E039 Hypothyroidism, unspecified: Secondary | ICD-10-CM

## 2023-10-25 ENCOUNTER — Other Ambulatory Visit: Payer: Self-pay | Admitting: Physician Assistant

## 2023-10-25 DIAGNOSIS — E559 Vitamin D deficiency, unspecified: Secondary | ICD-10-CM

## 2023-11-04 ENCOUNTER — Ambulatory Visit: Payer: 59

## 2023-11-04 ENCOUNTER — Other Ambulatory Visit: Payer: Self-pay | Admitting: Physician Assistant

## 2023-11-04 VITALS — BP 102/68 | HR 88

## 2023-11-04 DIAGNOSIS — I10 Essential (primary) hypertension: Secondary | ICD-10-CM

## 2023-11-04 MED ORDER — CARVEDILOL 25 MG PO TABS
25.0000 mg | ORAL_TABLET | Freq: Two times a day (BID) | ORAL | 1 refills | Status: DC
Start: 2023-11-04 — End: 2024-06-12

## 2023-11-04 NOTE — Progress Notes (Signed)
 Patient is in today for a Blood pressure check. Patent states her home readings are running about the same since adding new medication.   Took blood pressure manually and got 102/68 today in office.   Told patient I would let provider know and she states she will need the Coreg [facility]

## 2023-12-04 DIAGNOSIS — H9201 Otalgia, right ear: Secondary | ICD-10-CM | POA: Diagnosis not present

## 2023-12-04 DIAGNOSIS — J0100 Acute maxillary sinusitis, unspecified: Secondary | ICD-10-CM | POA: Diagnosis not present

## 2023-12-04 DIAGNOSIS — H6691 Otitis media, unspecified, right ear: Secondary | ICD-10-CM | POA: Diagnosis not present

## 2023-12-04 DIAGNOSIS — R0981 Nasal congestion: Secondary | ICD-10-CM | POA: Diagnosis not present

## 2024-01-05 ENCOUNTER — Other Ambulatory Visit: Payer: Self-pay | Admitting: Physician Assistant

## 2024-01-05 DIAGNOSIS — I10 Essential (primary) hypertension: Secondary | ICD-10-CM

## 2024-01-05 NOTE — Telephone Encounter (Signed)
 Called patient left message to call and schedule chronic follow up.

## 2024-01-13 DIAGNOSIS — R509 Fever, unspecified: Secondary | ICD-10-CM | POA: Diagnosis not present

## 2024-01-13 DIAGNOSIS — R059 Cough, unspecified: Secondary | ICD-10-CM | POA: Diagnosis not present

## 2024-01-17 ENCOUNTER — Other Ambulatory Visit: Payer: Self-pay | Admitting: Physician Assistant

## 2024-01-17 DIAGNOSIS — E039 Hypothyroidism, unspecified: Secondary | ICD-10-CM

## 2024-02-14 ENCOUNTER — Encounter: Payer: Self-pay | Admitting: Physician Assistant

## 2024-02-14 ENCOUNTER — Ambulatory Visit (INDEPENDENT_AMBULATORY_CARE_PROVIDER_SITE_OTHER): Admitting: Physician Assistant

## 2024-02-14 VITALS — BP 126/80 | HR 64 | Temp 98.2°F | Resp 18 | Ht 63.0 in | Wt 249.0 lb

## 2024-02-14 DIAGNOSIS — J069 Acute upper respiratory infection, unspecified: Secondary | ICD-10-CM

## 2024-02-14 DIAGNOSIS — J22 Unspecified acute lower respiratory infection: Secondary | ICD-10-CM

## 2024-02-14 LAB — POCT RESPIRATORY SYNCYTIAL VIRUS: RSV Rapid Ag: NEGATIVE

## 2024-02-14 MED ORDER — TRIAMCINOLONE ACETONIDE 40 MG/ML IJ SUSP
60.0000 mg | Freq: Once | INTRAMUSCULAR | Status: AC
Start: 2024-02-14 — End: 2024-02-14
  Administered 2024-02-14: 60 mg via INTRAMUSCULAR

## 2024-02-14 MED ORDER — AZITHROMYCIN 250 MG PO TABS
ORAL_TABLET | ORAL | 0 refills | Status: AC
Start: 2024-02-14 — End: 2024-02-19

## 2024-02-14 MED ORDER — ALBUTEROL SULFATE HFA 108 (90 BASE) MCG/ACT IN AERS
2.0000 | INHALATION_SPRAY | Freq: Four times a day (QID) | RESPIRATORY_TRACT | 2 refills | Status: AC | PRN
Start: 2024-02-14 — End: ?

## 2024-02-14 NOTE — Progress Notes (Signed)
 Acute Office Visit  Subjective:    Patient ID: Brandy Jimenez, female    DOB: 03/04/1971, 53 y.o.   MRN: [id number]  Chief Complaint  Patient presents with   Cough   Nasal Congestion    HPI: Patient is in today for complaints of cough, congestion and pnd for the past week.  Possibly has had low grade temp at night.  She was exposed to RSV.  She did a home COVID test which was negative Pt states she has had some mild wheezing also   Current Outpatient Medications:    albuterol (VENTOLIN HFA) 108 (90 Base) MCG/ACT inhaler, Inhale 2 puffs into the lungs every 6 (six) hours as needed for wheezing or shortness of breath., Disp: 8 g, Rfl: 2   azithromycin (ZITHROMAX) 250 MG tablet, Take 2 tablets on day 1, then 1 tablet daily on days 2 through 5, Disp: 6 tablet, Rfl: 0   carvedilol  (COREG ) 25 MG tablet, Take 1 tablet (25 mg total) by mouth 2 (two) times daily with a meal., Disp: 180 tablet, Rfl: 1   famotidine-calcium  carbonate-magnesium hydroxide (PEPCID COMPLETE) 10-800-165 MG chewable tablet, Chew 1 tablet by mouth at bedtime as needed (acid reflux)., Disp: , Rfl:    hydrochlorothiazide  (HYDRODIURIL ) 25 MG tablet, TAKE 1 TABLET (25 MG TOTAL) BY MOUTH DAILY., Disp: 30 tablet, Rfl: 2   hydrOXYzine  (ATARAX ) 25 MG tablet, TAKE ONE AND ONE-HALF TABLETS AT BEDTIME, Disp: 135 tablet, Rfl: 3   levothyroxine  (SYNTHROID ) 75 MCG tablet, TAKE 1 TABLET DAILY BEFORE BREAKFAST, Disp: 90 tablet, Rfl: 0   valACYclovir  (VALTREX ) 1000 MG tablet, Take by mouth., Disp: , Rfl:    Vitamin D , Ergocalciferol , (DRISDOL ) 1.25 MG (50000 UNIT) CAPS capsule, TAKE 1 CAPSULE TWICE A WEEK, Disp: 24 capsule, Rfl: 1   rosuvastatin  (CRESTOR ) 20 MG tablet, Take 1 tablet (20 mg total) by mouth daily., Disp: 90 tablet, Rfl: 1  Allergies  Allergen Reactions   Codeine Nausea And Vomiting and Other (See Comments)    Hallucinations and N/V  Other reaction(s): Other (See Comments)  Hallucinations and N/V   Tape Other  (See Comments)    Patient states adhesive tape irritates her skin, paper tape is more comfortable.  Other reaction(s): Other (See Comments)  Patient states adhesive tape irritates her skin, paper tape is more comfortable.   Tramadol Other (See Comments)    Keeps pt awake  Other reaction(s): Other (See Comments)  Keeps pt awake    ROS CONSTITUTIONAL: see HPI E/N/T: see HPI CARDIOVASCULAR: Negative for chest pain, dizziness, palpitations and pedal edema.  RESPIRATORY: see HPI      Objective:    PHYSICAL EXAM:   BP 126/80   Pulse 64   Temp 98.2 F (36.8 C) (Temporal)   Resp 18   Ht 5\' 3"  (1.6 m)   Wt 249 lb (112.9 kg)   LMP 06/28/2015   SpO2 96%   BMI 44.11 kg/m    GEN: Well nourished, well developed, in no acute distress  HEENT: normal external ears and nose - normal external auditory canals and TMS - - Lips, Teeth and Gums - normal  Oropharynx - mild erythema/pnd Cardiac: RRR; no murmurs,  Respiratory:  faint scattered wheezes  Office Visit on 02/14/2024  Component Date Value Ref Range Status   RSV Rapid Ag 02/14/2024 Negative   Final       Assessment & Plan:    Acute upper respiratory infection -     POCT respiratory syncytial  virus  Lower resp. tract infection -     Triamcinolone Acetonide 60mg  given IM -     Azithromycin; Take 2 tablets on day 1, then 1 tablet daily on days 2 through 5  Dispense: 6 tablet; Refill: 0 -     Albuterol Sulfate HFA; Inhale 2 puffs into the lungs every 6 (six) hours as needed for wheezing or shortness of breath.  Dispense: 8 g; Refill: 2     Follow-up: Return in about 4 weeks (around 03/13/2024) for chronic fasting follow-up - 40 min.  An After Visit Summary was printed and given to the patient.  Anthonette Bastos [facility] [phone]

## 2024-02-14 NOTE — Progress Notes (Deleted)
 cough

## 2024-03-06 ENCOUNTER — Other Ambulatory Visit: Payer: Self-pay | Admitting: Physician Assistant

## 2024-03-06 DIAGNOSIS — E782 Mixed hyperlipidemia: Secondary | ICD-10-CM

## 2024-03-12 IMAGING — MG MM DIGITAL SCREENING BILAT W/ TOMO AND CAD
6 of 10 series · 6 of 30 positions shown · non-contrast
Comparison: Previous exam(s).

CLINICAL DATA: Screening.

EXAM:
DIGITAL SCREENING BILATERAL MAMMOGRAM WITH TOMOSYNTHESIS AND CAD
TECHNIQUE: Bilateral screening digital craniocaudal and mediolateral oblique
mammograms were obtained. Bilateral screening digital breast
tomosynthesis was performed. The images were evaluated with
computer-aided detection.

[R MLO synth-2D]
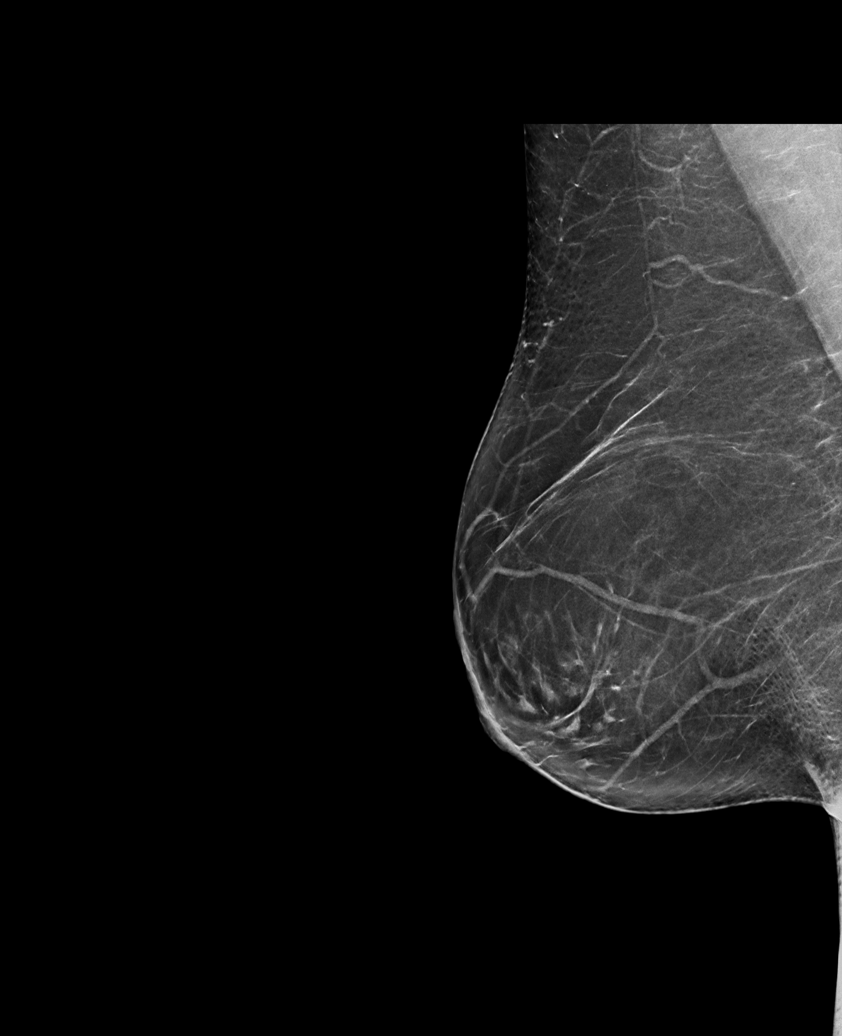

[L CC synth-2D (1 of 2)]
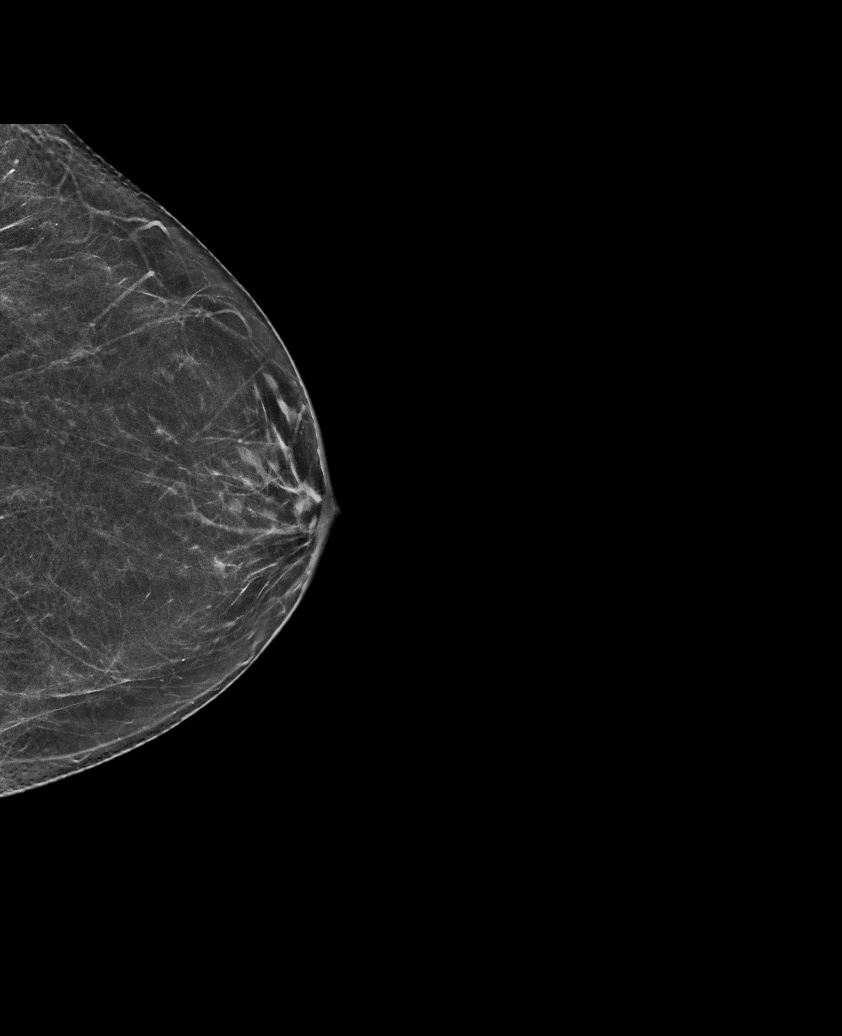

[L MLO synth-2D]
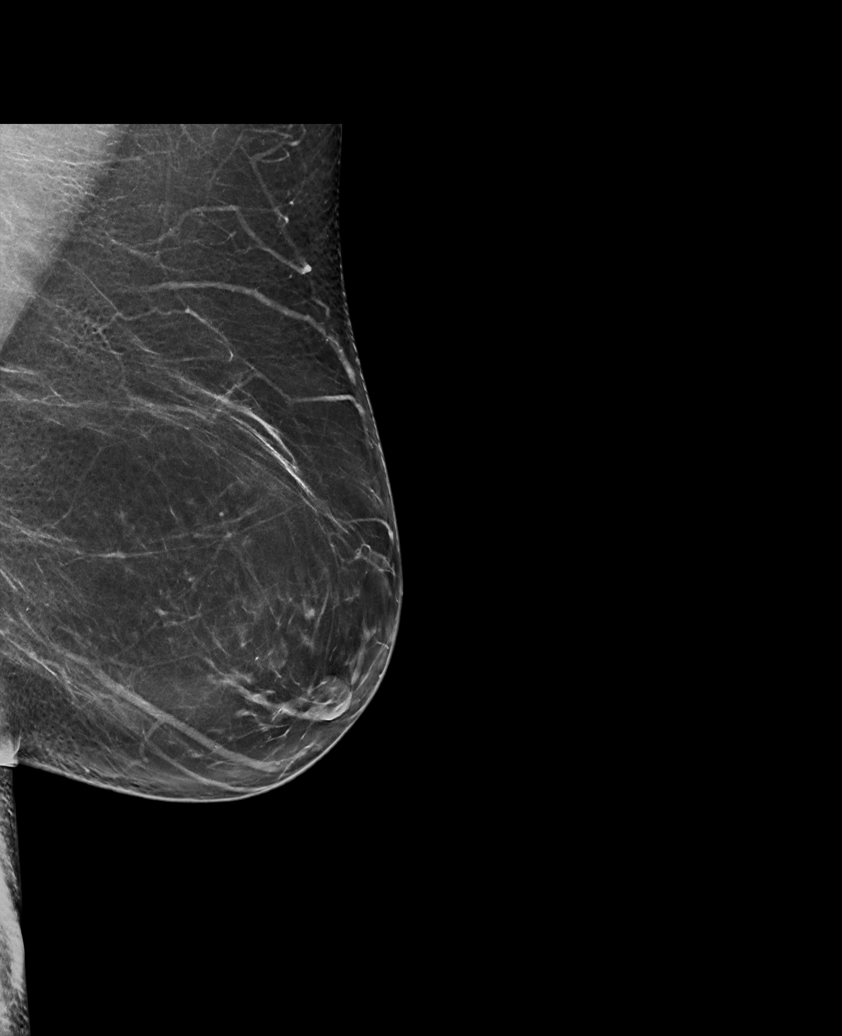

[R CC synth-2D]
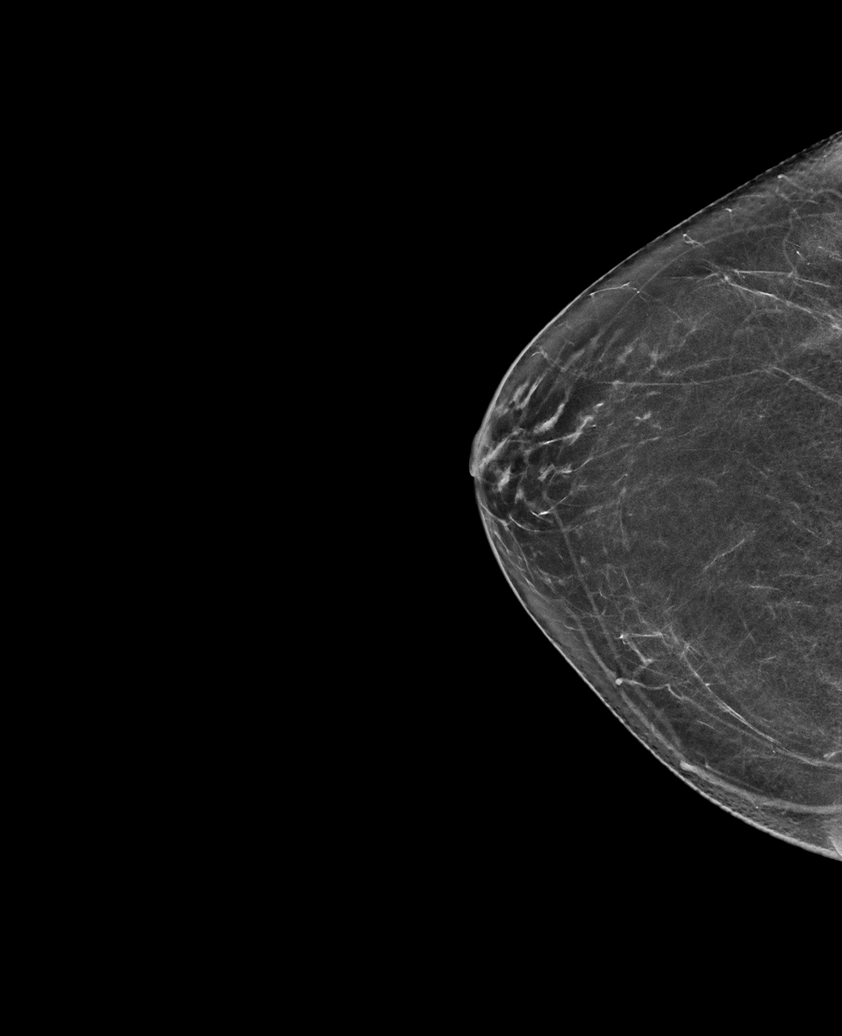

[L CC synth-2D (2 of 2)]
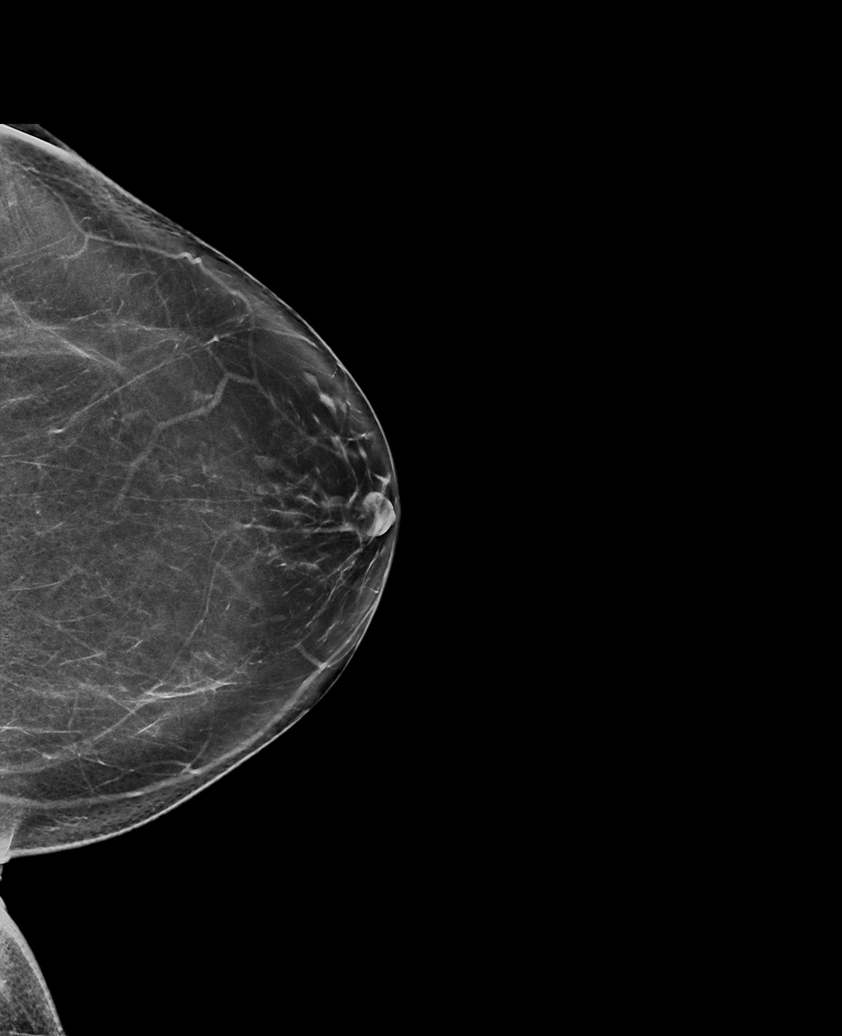

[R CC tomo · tomo slice 33/64.0]
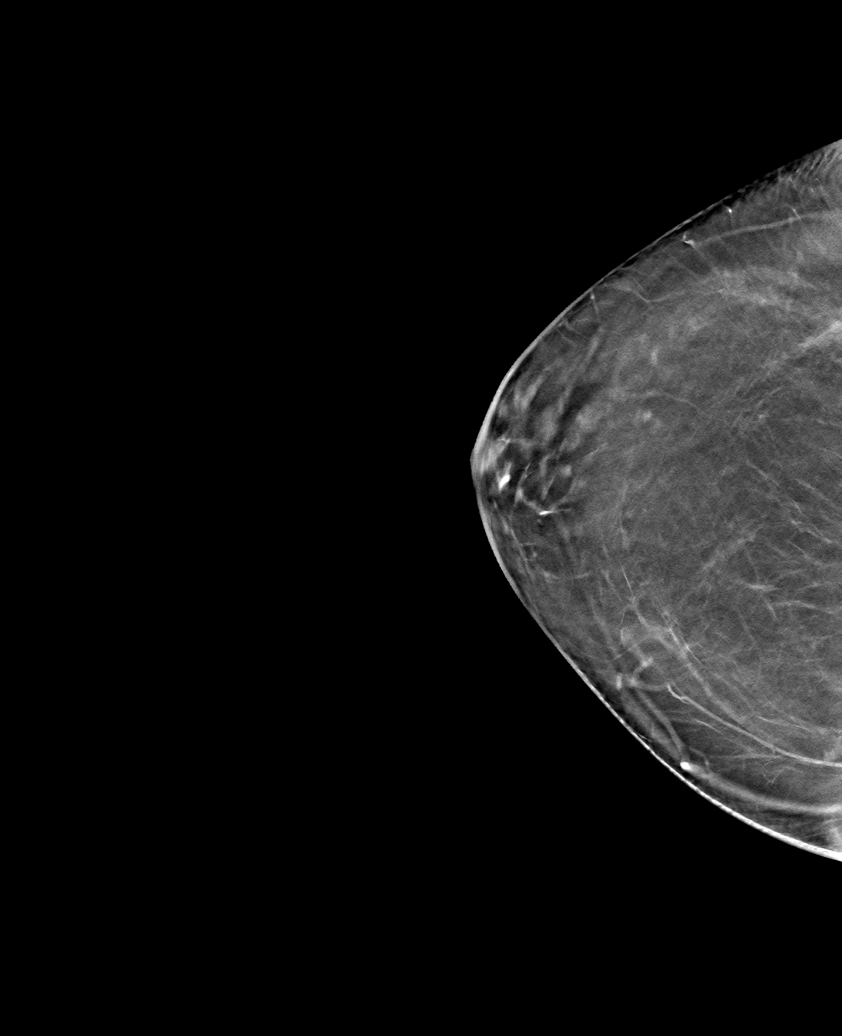

[6 of 30 positions shown; findings below may reference images not displayed]

ACR Breast Density Category b: There are scattered areas of
fibroglandular density.
FINDINGS: There are no findings suspicious for malignancy.
IMPRESSION: No mammographic evidence of malignancy. A result letter of this
screening mammogram will be mailed directly to the patient.

RECOMMENDATION:
Screening mammogram in one year. (Code:51-O-LD2)

BI-RADS CATEGORY  1: Negative.

## 2024-03-22 ENCOUNTER — Ambulatory Visit (INDEPENDENT_AMBULATORY_CARE_PROVIDER_SITE_OTHER): Admitting: Physician Assistant

## 2024-03-22 ENCOUNTER — Encounter: Payer: Self-pay | Admitting: Physician Assistant

## 2024-03-22 VITALS — BP 130/88 | HR 79 | Temp 97.7°F | Ht 63.0 in | Wt 247.0 lb

## 2024-03-22 DIAGNOSIS — R7303 Prediabetes: Secondary | ICD-10-CM | POA: Insufficient documentation

## 2024-03-22 DIAGNOSIS — E039 Hypothyroidism, unspecified: Secondary | ICD-10-CM

## 2024-03-22 DIAGNOSIS — E782 Mixed hyperlipidemia: Secondary | ICD-10-CM

## 2024-03-22 DIAGNOSIS — E559 Vitamin D deficiency, unspecified: Secondary | ICD-10-CM

## 2024-03-22 DIAGNOSIS — I700 Atherosclerosis of aorta: Secondary | ICD-10-CM | POA: Insufficient documentation

## 2024-03-22 DIAGNOSIS — I10 Essential (primary) hypertension: Secondary | ICD-10-CM
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
## 2024-03-23 ENCOUNTER — Ambulatory Visit: Payer: Self-pay | Admitting: Physician Assistant

## 2024-03-23 LAB — COMPREHENSIVE METABOLIC PANEL WITH GFR
ALT: 33 IU/L — ABNORMAL HIGH (ref 0–32)
AST: 22 IU/L (ref 0–40)
Albumin: 4.6 g/dL (ref 3.8–4.9)
Alkaline Phosphatase: 117 IU/L (ref 44–121)
BUN/Creatinine Ratio: 29 — ABNORMAL HIGH (ref 9–23)
BUN: 17 mg/dL (ref 6–24)
Bilirubin Total: 0.5 mg/dL (ref 0.0–1.2)
CO2: 23 mmol/L (ref 20–29)
Calcium: 9.6 mg/dL (ref 8.7–10.2)
Chloride: 102 mmol/L (ref 96–106)
Creatinine, Ser: 0.58 mg/dL (ref 0.57–1.00)
Globulin, Total: 2.9 g/dL (ref 1.5–4.5)
Glucose: 100 mg/dL — ABNORMAL HIGH (ref 70–99)
Potassium: 4.2 mmol/L (ref 3.5–5.2)
Sodium: 143 mmol/L (ref 134–144)
Total Protein: 7.5 g/dL (ref 6.0–8.5)
eGFR: 107 mL/min/1.73 (ref >59)

## 2024-03-23 LAB — CBC WITH DIFFERENTIAL/PLATELET
Basophils Absolute: 0 x10E3/uL (ref 0.0–0.2)
Basos: 0 %
EOS (ABSOLUTE): 0.1 x10E3/uL (ref 0.0–0.4)
Eos: 1 %
Hematocrit: 45.4 % (ref 34.0–46.6)
Hemoglobin: 14.7 g/dL (ref 11.1–15.9)
Immature Grans (Abs): 0 x10E3/uL (ref 0.0–0.1)
Immature Granulocytes: 0 %
Lymphocytes Absolute: 2.3 x10E3/uL (ref 0.7–3.1)
Lymphs: 25 %
MCH: 27.6 pg (ref 26.6–33.0)
MCHC: 32.4 g/dL (ref 31.5–35.7)
MCV: 85 fL (ref 79–97)
Monocytes Absolute: 0.5 x10E3/uL (ref 0.1–0.9)
Monocytes: 5 %
Neutrophils Absolute: 6.3 x10E3/uL (ref 1.4–7.0)
Neutrophils: 69 %
Platelets: 281 x10E3/uL (ref 150–450)
RBC: 5.32 x10E6/uL — ABNORMAL HIGH (ref 3.77–5.28)
RDW: 13.1 % (ref 11.7–15.4)
WBC: 9.2 x10E3/uL (ref 3.4–10.8)

## 2024-03-23 LAB — LIPID PANEL
Chol/HDL Ratio: 2.3 ratio (ref 0.0–4.4)
Cholesterol, Total: 140 mg/dL (ref 100–199)
HDL: 62 mg/dL (ref >39)
LDL Chol Calc (NIH): 62 mg/dL (ref 0–99)
Triglycerides: 86 mg/dL (ref 0–149)
VLDL Cholesterol Cal: 16 mg/dL (ref 5–40)

## 2024-03-23 LAB — HEMOGLOBIN A1C
Est. average glucose Bld gHb Est-mCnc: 114 mg/dL
Hgb A1c MFr Bld: 5.6 % (ref 4.8–5.6)

## 2024-03-23 LAB — VITAMIN D 25 HYDROXY (VIT D DEFICIENCY, FRACTURES): Vit D, 25-Hydroxy: 44.5 ng/mL (ref 30.0–100.0)

## 2024-03-23 LAB — TSH: TSH: 2.36 u[IU]/mL (ref 0.450–4.500)

## 2024-04-10 ENCOUNTER — Other Ambulatory Visit: Payer: Self-pay | Admitting: Physician Assistant

## 2024-04-10 DIAGNOSIS — E559 Vitamin D deficiency, unspecified: Secondary | ICD-10-CM

## 2024-04-17 ENCOUNTER — Other Ambulatory Visit: Payer: Self-pay | Admitting: Physician Assistant

## 2024-04-17 DIAGNOSIS — E039 Hypothyroidism, unspecified: Secondary | ICD-10-CM

## 2024-04-23 ENCOUNTER — Other Ambulatory Visit: Payer: Self-pay | Admitting: Physician Assistant

## 2024-04-23 DIAGNOSIS — I10 Essential (primary) hypertension: Secondary | ICD-10-CM

## 2024-06-12 ENCOUNTER — Other Ambulatory Visit: Payer: Self-pay | Admitting: Physician Assistant

## 2024-06-12 DIAGNOSIS — I10 Essential (primary) hypertension: Secondary | ICD-10-CM

## 2024-06-12 MED ORDER — CARVEDILOL 25 MG PO TABS: 25.0000 mg | ORAL_TABLET | Freq: Two times a day (BID) | ORAL | 0 refills | Status: DC

## 2024-06-12 NOTE — Telephone Encounter (Signed)
 Copied from CRM [id number]. Topic: Clinical - Medication Refill >> Jun 12, 2024  8:50 AM Tiffini S wrote: Medication: carvedilol  (COREG ) 25 MG tablet  Has the patient contacted their pharmacy? No (Agent: If no, request that the patient contact the pharmacy for the refill. If patient does not wish to contact the pharmacy document the reason why and proceed with request.) (Agent: If yes, when and what did the pharmacy advise?)  This is the patient's preferred pharmacy:  [facility] - [place], [place] - [address] [place] [address] Phone: [phone] Fax: [phone]    Is this the correct pharmacy for this prescription? Yes If no, delete pharmacy and type the correct one.   Has the prescription been filled recently? Yes  Is the patient out of the medication? No, have a few tablets left   Has the patient been seen for an appointment in the last year OR does the patient have an upcoming appointment? Yes  Can we respond through MyChart? No, pleas call at [phone]  Agent: Please be advised that Rx refills may take up to 3 business days. We ask that you follow-up with your pharmacy.

## 2024-06-19 ENCOUNTER — Ambulatory Visit

## 2024-08-02 ENCOUNTER — Other Ambulatory Visit: Payer: Self-pay | Admitting: Physician Assistant

## 2024-08-02 DIAGNOSIS — I10 Essential (primary) hypertension: Secondary | ICD-10-CM

## 2024-08-24 ENCOUNTER — Ambulatory Visit (INDEPENDENT_AMBULATORY_CARE_PROVIDER_SITE_OTHER): Admitting: Physician Assistant

## 2024-08-24 ENCOUNTER — Encounter: Payer: Self-pay | Admitting: Physician Assistant

## 2024-08-24 VITALS — BP 130/76 | Temp 98.0°F | Ht 63.0 in | Wt 247.0 lb

## 2024-08-24 DIAGNOSIS — I10 Essential (primary) hypertension: Secondary | ICD-10-CM | POA: Diagnosis not present

## 2024-08-24 DIAGNOSIS — J060 Acute laryngopharyngitis: Secondary | ICD-10-CM | POA: Diagnosis not present

## 2024-08-24 LAB — POC COVID19 BINAXNOW: SARS Coronavirus 2 Ag: NEGATIVE

## 2024-08-24 LAB — POCT INFLUENZA A/B
Influenza A, POC: NEGATIVE
Influenza B, POC: NEGATIVE

## 2024-08-24 MED ORDER — PREDNISONE 20 MG PO TABS: 20.0000 mg | ORAL_TABLET | Freq: Two times a day (BID) | ORAL | 0 refills | Status: AC

## 2024-08-24 MED ORDER — HYDROCHLOROTHIAZIDE 25 MG PO TABS: 25.0000 mg | ORAL_TABLET | Freq: Every day | ORAL | 1 refills | Status: AC

## 2024-08-24 MED ORDER — CEFDINIR 300 MG PO CAPS: 300.0000 mg | ORAL_CAPSULE | Freq: Two times a day (BID) | ORAL | 0 refills | Status: AC

## 2024-08-24 NOTE — Progress Notes (Signed)
 Acute Office Visit  Subjective:    Patient ID: Brandy Jimenez, female    DOB: April 15, 1971, 53 y.o.   MRN: [id number]  Chief Complaint  Patient presents with   Cough   Fatigue    HPI: Patient is in today for complaints of intermittent cough and congestion for the past month but worse in the past week - she did have a low grade fever earlier in week and has had malaise Cough is now productive Has been using otc decongestants - not having to use inhaler at this time  Current Medications[1]  Allergies[2]  ROS CONSTITUTIONAL: see HPI E/N/T: see HPI CARDIOVASCULAR: Negative for chest pain,  RESPIRATORY: see HPI GASTROINTESTINAL: Negative for abdominal pain, acid reflux symptoms, constipation, diarrhea, nausea and vomiting.  MSK: Negative for arthralgias and myalgias.       Objective:    PHYSICAL EXAM:   BP 130/76   Temp 98 F (36.7 C)   Ht 5' 3 (1.6 m)   Wt 247 lb (112 kg)   LMP 06/28/2015   BMI 43.75 kg/m    GEN: Well nourished, well developed, in no acute distress  HEENT: normal external ears and nose - normal external auditory canals and TMS - - Lips, Teeth and Gums - normal  Oropharynx - erythema/pnd Cardiac: RRR; no murmurs, rubs, Respiratory:  faint rhonchi noted and mild wheeze noted GI: normal bowel sounds, no masses or tenderness Skin: warm and dry, no rash  Psych: euthymic mood, appropriate affect and demeanor     Office Visit on 08/24/2024  Component Date Value Ref Range Status   Influenza A, POC 08/24/2024 Negative  Negative Final   Influenza B, POC 08/24/2024 Negative  Negative Final   SARS Coronavirus 2 Ag 08/24/2024 Negative  Negative Final    Assessment & Plan:    Acute laryngopharyngitis -     predniSONE ; Take 1 tablet (20 mg total) by mouth 2 (two) times daily with a meal.  Dispense: 10 tablet; Refill: 0 -     Cefdinir ; Take 1 capsule (300 mg total) by mouth 2 (two) times daily.  Dispense: 20 capsule; Refill: 0 -     POCT  Influenza A/B -     POC COVID-19 BinaxNow  Primary hypertension -     hydroCHLOROthiazide ; Take 1 tablet (25 mg total) by mouth daily.  Dispense: 90 tablet; Refill: 1     Follow-up: Return if symptoms worsen or fail to improve.  An After Visit Summary was printed and given to the patient.  SARA R Naif Alabi, PA-C [facility] [phone]     [1]  Current Outpatient Medications:    cefdinir  (OMNICEF ) 300 MG capsule, Take 1 capsule (300 mg total) by mouth 2 (two) times daily., Disp: 20 capsule, Rfl: 0   predniSONE  (DELTASONE ) 20 MG tablet, Take 1 tablet (20 mg total) by mouth 2 (two) times daily with a meal., Disp: 10 tablet, Rfl: 0   albuterol  (VENTOLIN  HFA) 108 (90 Base) MCG/ACT inhaler, Inhale 2 puffs into the lungs every 6 (six) hours as needed for wheezing or shortness of breath. (Patient not taking: Reported on 03/22/2024), Disp: 8 g, Rfl: 2   carvedilol  (COREG ) 25 MG tablet, Take 1 tablet (25 mg total) by mouth 2 (two) times daily with a meal., Disp: 180 tablet, Rfl: 0   famotidine-calcium  carbonate-magnesium hydroxide (PEPCID COMPLETE) 10-800-165 MG chewable tablet, Chew 1 tablet by mouth at bedtime as needed (acid reflux)., Disp: , Rfl:    hydrochlorothiazide  (  HYDRODIURIL ) 25 MG tablet, Take 1 tablet (25 mg total) by mouth daily., Disp: 90 tablet, Rfl: 1   hydrOXYzine  (ATARAX ) 25 MG tablet, TAKE ONE AND ONE-HALF TABLETS AT BEDTIME, Disp: 135 tablet, Rfl: 3   levothyroxine  (SYNTHROID ) 75 MCG tablet, TAKE 1 TABLET DAILY BEFORE BREAKFAST, Disp: 90 tablet, Rfl: 1   rosuvastatin  (CRESTOR ) 20 MG tablet, TAKE 1 TABLET DAILY, Disp: 90 tablet, Rfl: 1   valACYclovir  (VALTREX ) 1000 MG tablet, Take by mouth., Disp: , Rfl:    Vitamin D , Ergocalciferol , (DRISDOL ) 1.25 MG (50000 UNIT) CAPS capsule, TAKE 1 CAPSULE TWICE A WEEK, Disp: 24 capsule, Rfl: 3 [2]  Allergies Allergen Reactions   Codeine Nausea And Vomiting and Other (See Comments)    Hallucinations and N/V  Other reaction(s):  Other (See Comments)  Hallucinations and N/V   Tape Other (See Comments)    Patient states adhesive tape irritates her skin, paper tape is more comfortable.  Other reaction(s): Other (See Comments)  Patient states adhesive tape irritates her skin, paper tape is more comfortable.   Tramadol Other (See Comments)    Keeps pt awake  Other reaction(s): Other (See Comments)  Keeps pt awake

## 2024-08-25 ENCOUNTER — Ambulatory Visit (INDEPENDENT_AMBULATORY_CARE_PROVIDER_SITE_OTHER)

## 2024-08-25 DIAGNOSIS — Z111 Encounter for screening for respiratory tuberculosis: Secondary | ICD-10-CM | POA: Diagnosis not present

## 2024-08-25 NOTE — Progress Notes (Signed)
Patient is in office today for a nurse visit for PPD. Patient Injection was given in the  Left arm. Patient tolerated injection well.

## 2024-08-28 ENCOUNTER — Ambulatory Visit

## 2024-08-28 LAB — TB SKIN TEST
Induration: 0 mm
TB Skin Test: NEGATIVE

## 2024-08-28 NOTE — Progress Notes (Signed)
 Patient is in office today for a nurse visit for PPD. Patient PPD Reading Note PPD read and results entered in EpicCare. Result: 0 mm induration. Interpretation: negative If test not read within 48-72 hours of initial placement, patient advised to repeat in other arm 1-3 weeks after this test. Allergic reaction: no

## 2024-08-29 ENCOUNTER — Other Ambulatory Visit: Payer: Self-pay | Admitting: Physician Assistant

## 2024-08-29 DIAGNOSIS — I10 Essential (primary) hypertension: Secondary | ICD-10-CM

## 2024-09-04 ENCOUNTER — Other Ambulatory Visit: Payer: Self-pay | Admitting: Physician Assistant

## 2024-09-04 DIAGNOSIS — E782 Mixed hyperlipidemia: Secondary | ICD-10-CM

## 2024-09-15 ENCOUNTER — Other Ambulatory Visit: Payer: Self-pay | Admitting: Physician Assistant

## 2024-09-15 DIAGNOSIS — F5101 Primary insomnia: Secondary | ICD-10-CM

## 2024-10-06 ENCOUNTER — Ambulatory Visit: Admitting: Physician Assistant

## 2024-10-10 ENCOUNTER — Ambulatory Visit: Payer: Self-pay

## 2024-10-10 ENCOUNTER — Emergency Department (HOSPITAL_BASED_OUTPATIENT_CLINIC_OR_DEPARTMENT_OTHER)

## 2024-10-10 ENCOUNTER — Other Ambulatory Visit: Payer: Self-pay

## 2024-10-10 ENCOUNTER — Emergency Department (HOSPITAL_BASED_OUTPATIENT_CLINIC_OR_DEPARTMENT_OTHER)
Admission: EM | Admit: 2024-10-10 | Discharge: 2024-10-10 | Disposition: A | Discharge status: Home / Self Care | Attending: Emergency Medicine | Admitting: Emergency Medicine

## 2024-10-10 ENCOUNTER — Encounter (HOSPITAL_BASED_OUTPATIENT_CLINIC_OR_DEPARTMENT_OTHER): Payer: Self-pay | Admitting: Emergency Medicine

## 2024-10-10 DIAGNOSIS — N200 Calculus of kidney: Secondary | ICD-10-CM | POA: Insufficient documentation

## 2024-10-10 LAB — URINALYSIS, MICROSCOPIC (REFLEX)
RBC / HPF: >50 RBC/hpf (ref 0–5)
WBC, UA: NONE SEEN WBC/hpf (ref 0–5)

## 2024-10-10 LAB — URINALYSIS, ROUTINE W REFLEX MICROSCOPIC
Bilirubin Urine: NEGATIVE
Glucose, UA: NEGATIVE mg/dL
Ketones, ur: NEGATIVE mg/dL
Leukocytes,Ua: NEGATIVE
Nitrite: NEGATIVE
Protein, ur: 100 mg/dL — AB
Specific Gravity, Urine: 1.02 (ref 1.005–1.030)
pH: 6 (ref 5.0–8.0)

## 2024-10-10 LAB — COMPREHENSIVE METABOLIC PANEL WITH GFR
ALT: 29 U/L (ref 0–44)
AST: 27 U/L (ref 15–41)
Albumin: 4.4 g/dL (ref 3.5–5.0)
Alkaline Phosphatase: 97 U/L (ref 38–126)
Anion gap: 15 (ref 5–15)
BUN: 11 mg/dL (ref 6–20)
CO2: 28 mmol/L (ref 22–32)
Calcium: 9.4 mg/dL (ref 8.9–10.3)
Chloride: 99 mmol/L (ref 98–111)
Creatinine, Ser: 0.46 mg/dL (ref 0.44–1.00)
GFR, Estimated: >60 mL/min
Glucose, Bld: 96 mg/dL (ref 70–99)
Potassium: 3.6 mmol/L (ref 3.5–5.1)
Sodium: 142 mmol/L (ref 135–145)
Total Bilirubin: 0.6 mg/dL (ref 0.0–1.2)
Total Protein: 7.7 g/dL (ref 6.5–8.1)

## 2024-10-10 LAB — CBC
HCT: 41.5 % (ref 36.0–46.0)
Hemoglobin: 14.1 g/dL (ref 12.0–15.0)
MCH: 27.5 pg (ref 26.0–34.0)
MCHC: 34 g/dL (ref 30.0–36.0)
MCV: 80.9 fL (ref 80.0–100.0)
Platelets: 231 10*3/uL (ref 150–400)
RBC: 5.13 MIL/uL — ABNORMAL HIGH (ref 3.87–5.11)
RDW: 13 % (ref 11.5–15.5)
WBC: 9.7 10*3/uL (ref 4.0–10.5)
nRBC: 0 % (ref 0.0–0.2)

## 2024-10-10 LAB — LIPASE, BLOOD: Lipase: 32 U/L (ref 11–51)

## 2024-10-10 MED ORDER — IBUPROFEN 600 MG PO TABS: 600.0000 mg | ORAL_TABLET | Freq: Four times a day (QID) | ORAL | 0 refills | Status: AC | PRN

## 2024-10-10 MED ORDER — TAMSULOSIN HCL 0.4 MG PO CAPS: 0.4000 mg | ORAL_CAPSULE | Freq: Every day | ORAL | 0 refills | Status: AC

## 2024-10-10 MED ORDER — ONDANSETRON 4 MG PO TBDP: 4.0000 mg | ORAL_TABLET | Freq: Three times a day (TID) | ORAL | 0 refills | Status: AC | PRN

## 2024-10-10 MED ORDER — OXYCODONE-ACETAMINOPHEN 5-325 MG PO TABS: 1.0000 | ORAL_TABLET | Freq: Four times a day (QID) | ORAL | 0 refills | Status: AC | PRN

## 2024-10-10 MED ORDER — KETOROLAC TROMETHAMINE 15 MG/ML IJ SOLN
15.0000 mg | Freq: Once | INTRAMUSCULAR | Status: AC
  Administered 2024-10-10: 15 mg via INTRAVENOUS
  Filled 2024-10-10: qty 1

## 2024-10-10 MED ORDER — IOHEXOL 300 MG/ML  SOLN
125.0000 mL | Freq: Once | INTRAMUSCULAR | Status: AC | PRN
  Administered 2024-10-10: 100 mL via INTRAVENOUS

## 2024-10-10 NOTE — Telephone Encounter (Signed)
 Per Laney CMA pt is going to go ahead to Urgent Care

## 2024-10-10 NOTE — Telephone Encounter (Signed)
 FYI Only or Action Required?: Action required by provider: request for appointment. Please call back today.   Patient was last seen in primary care on 08/24/2024 by Nicholaus Credit, PA-C.  Called Nurse Triage reporting Hematuria.  Symptoms began several days ago.  Interventions attempted: Nothing.  Symptoms are: unchanged.  Triage Disposition: See Physician Within 24 Hours  Patient/caregiver understands and will follow disposition?: No, wishes to speak with PCP  Reason for Triage: Blood in urine with no other symptoms - started on 10/08/24    Reason for Disposition  Blood in urine  (Exception: Could be normal menstrual bleeding.)  Answer Assessment - Initial Assessment Questions 1. COLOR of URINE: Describe the color of the urine.  (e.g., tea-colored, pink, red, bloody) Do you have blood clots in your urine? (e.g., none, pea, grape, small coin)     Depends, sometimes tea-colored sometimes pink 2. ONSET: When did the bleeding start?      About 2 days ago 3. EPISODES: How many times has there been blood in the urine? or How many times today?     Each time pt voids 4. PAIN with URINATION: Is there any pain with passing your urine? If Yes, ask: How bad is the pain?  (Scale 1-10; or mild, moderate, severe)     denies 5. FEVER: Do you have a fever? If Yes, ask: What is your temperature, how was it measured, and when did it start?     denies 6. ASSOCIATED SYMPTOMS: Are you passing urine more frequently than usual?     denies 7. OTHER SYMPTOMS: Do you have any other symptoms? (e.g., back/flank pain, abdomen pain, vomiting)     Denies  Offered appt tomorrow with PCP, pt declines at this time, states that she would like to see PCP today, states that she would like PCP to be asked about appt options with her today.  Protocols used: Urine - Blood In-A-AH

## 2024-10-10 NOTE — Discharge Instructions (Addendum)
 You had a 5mm kidney stone located in your ureter. Strain all urine with a strainer that was provided to you.  Make sure you drink plenty of water to maintain hydration.  Please use Tylenol  or ibuprofen  for pain.  You may use 600 mg ibuprofen  every 6 hours or 1000 mg of Tylenol  every 6 hours.  You may choose to alternate between the 2.  This would be most effective.  Not to exceed 4 g of Tylenol  within 24 hours.  Not to exceed 3200 mg ibuprofen  24 hours.     I have prescribed or offered you other medicine for breakthrough pain. Notably there is a small amount of Tylenol --specifically 325 mg--in each dose of Percocet.  If you do take a dose of Percocet please take a 500 mg instead of the 1000 mg dose of Tylenol .     Follow up with [facility] I have given you the information for their office.  Generally kidney stones pass on their own given time in the focus of treatment is minimizing pain

## 2024-10-10 NOTE — ED Provider Notes (Signed)
 " [facility] Provider Note   CSN: [id number] Arrival date & time: 10/10/24  1258     Patient presents with: Hematuria   Brandy Jimenez is a 54 y.o. female.    Hematuria   Patient with painless hematuria for 3 days has a history of kidney stones.  Denies any pain, fever, nausea, vomiting no urinary urgency or hesitancy or dysuria.  No vaginal discharge she is certain that this is not vaginal bleeding.      Prior to Admission medications  Medication Sig Start Date End Date Taking? Authorizing Provider  ibuprofen  (ADVIL ) 600 MG tablet Take 1 tablet (600 mg total) by mouth every 6 (six) hours as needed. 10/10/24  Yes Muhammadali Ries S, PA  ondansetron  (ZOFRAN -ODT) 4 MG disintegrating tablet Take 1 tablet (4 mg total) by mouth every 8 (eight) hours as needed for nausea or vomiting. 10/10/24  Yes Corynne Scibilia, Hamp S, PA  oxyCODONE -acetaminophen  (PERCOCET/ROXICET) 5-325 MG tablet Take 1 tablet by mouth every 6 (six) hours as needed for severe pain (pain score 7-10). 10/10/24  Yes Farhan Jean, Hamp S, PA  tamsulosin  (FLOMAX ) 0.4 MG CAPS capsule Take 1 capsule (0.4 mg total) by mouth daily after supper. 10/10/24  Yes Rea Reser S, PA  albuterol  (VENTOLIN  HFA) 108 (90 Base) MCG/ACT inhaler Inhale 2 puffs into the lungs every 6 (six) hours as needed for wheezing or shortness of breath. Patient not taking: Reported on 03/22/2024 02/14/24   Nicholaus Credit, PA-C  carvedilol  (COREG ) 25 MG tablet TAKE 1 TABLET TWICE A DAY WITH MEALS 08/29/24   Nicholaus Credit, PA-C  cefdinir  (OMNICEF ) 300 MG capsule Take 1 capsule (300 mg total) by mouth 2 (two) times daily. 08/24/24   Nicholaus Credit, PA-C  famotidine-calcium  carbonate-magnesium hydroxide (PEPCID COMPLETE) 10-800-165 MG chewable tablet Chew 1 tablet by mouth at bedtime as needed (acid reflux).    [provider]  hydrochlorothiazide  (HYDRODIURIL ) 25 MG tablet Take 1 tablet (25 mg total) by mouth daily. 08/24/24    Nicholaus Credit, PA-C  hydrOXYzine  (ATARAX ) 25 MG tablet TAKE ONE AND ONE-HALF TABLETS AT BEDTIME 09/15/24   Nicholaus Credit, PA-C  levothyroxine  (SYNTHROID ) 75 MCG tablet TAKE 1 TABLET DAILY BEFORE BREAKFAST 04/17/24   Nicholaus Credit, PA-C  predniSONE  (DELTASONE ) 20 MG tablet Take 1 tablet (20 mg total) by mouth 2 (two) times daily with a meal. 08/24/24   Nicholaus Credit, PA-C  rosuvastatin  (CRESTOR ) 20 MG tablet TAKE 1 TABLET DAILY 09/04/24   Nicholaus Credit, PA-C  valACYclovir  (VALTREX ) 1000 MG tablet Take by mouth. 06/23/23   [provider]  Vitamin D , Ergocalciferol , (DRISDOL ) 1.25 MG (50000 UNIT) CAPS capsule TAKE 1 CAPSULE TWICE A WEEK 04/10/24   Nicholaus Credit, PA-C    Allergies: Codeine, Tape, and Tramadol    Review of Systems  Genitourinary:  Positive for hematuria.    Updated Vital Signs BP (!) 149/83   Pulse 70   Temp 98.1 F (36.7 C)   Resp 15   Ht 5' 3 (1.6 m)   Wt 113.4 kg   LMP 06/28/2015   SpO2 96%   BMI 44.29 kg/m   Physical Exam Vitals and nursing note reviewed.  Constitutional:      General: She is not in acute distress. HENT:     Head: Normocephalic and atraumatic.     Nose: Nose normal.  Eyes:     General: No scleral icterus. Cardiovascular:     Rate and Rhythm: Normal rate and regular  rhythm.     Pulses: Normal pulses.     Heart sounds: Normal heart sounds.  Pulmonary:     Effort: Pulmonary effort is normal. No respiratory distress.     Breath sounds: No wheezing.  Abdominal:     Palpations: Abdomen is soft.     Tenderness: There is no abdominal tenderness.  Musculoskeletal:     Cervical back: Normal range of motion.     Right lower leg: No edema.     Left lower leg: No edema.  Skin:    General: Skin is warm and dry.     Capillary Refill: Capillary refill takes less than 2 seconds.  Neurological:     Mental Status: She is alert. Mental status is at baseline.  Psychiatric:        Mood and Affect: Mood normal.        Behavior: Behavior normal.      (all labs ordered are listed, but only abnormal results are displayed) Labs Reviewed  URINALYSIS, ROUTINE W REFLEX MICROSCOPIC - Abnormal; Notable for the following components:      Result Value   Color, Urine RED (*)    APPearance CLOUDY (*)    Hgb urine dipstick LARGE (*)    Protein, ur 100 (*)    All other components within normal limits  URINALYSIS, MICROSCOPIC (REFLEX) - Abnormal; Notable for the following components:   Bacteria, UA RARE (*)    All other components within normal limits  CBC - Abnormal; Notable for the following components:   RBC 5.13 (*)    All other components within normal limits  LIPASE, BLOOD  COMPREHENSIVE METABOLIC PANEL WITH GFR    EKG: None  Radiology: CT ABDOMEN PELVIS W CONTRAST Result Date: 10/10/2024 EXAM: CT ABDOMEN AND PELVIS WITH CONTRAST 10/10/2024 05:17:03 PM TECHNIQUE: CT of the abdomen and pelvis was performed with the administration of 100 mL of iohexol  (OMNIPAQUE ) 300 MG/ML solution. Multiplanar reformatted images are provided for review. Automated exposure control, iterative reconstruction, and/or weight-based adjustment of the mA/kV was utilized to reduce the radiation dose to as low as reasonably achievable. COMPARISON: 03/14/2008 CLINICAL HISTORY: Abdominal/flank pain, stone suspected. Very mild right flank pain with significant hematuria. FINDINGS: LOWER CHEST: No acute abnormality. LIVER: Diffuse hepatic steatosis. GALLBLADDER AND BILE DUCTS: Gallbladder is unremarkable. No biliary ductal dilatation. SPLEEN: No acute abnormality. PANCREAS: No acute abnormality. ADRENAL GLANDS: No acute abnormality. KIDNEYS, URETERS AND BLADDER: 6 mm, nonobstructive calculus in the right renal pelvis. No hydronephrosis. No perinephric or periureteral stranding. Urinary bladder is unremarkable. GI AND BOWEL: Stomach demonstrates no acute abnormality. 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 Decompressed normal appendix. There is no bowel  obstruction. PERITONEUM AND RETROPERITONEUM: No ascites. No free air. VASCULATURE: Aorta is normal in caliber. LYMPH NODES: No lymphadenopathy. REPRODUCTIVE ORGANS: Hysterectomy. BONES AND SOFT TISSUES: Mild multilevel thoracolumbar osteophytosis. No acute osseous abnormality. No focal soft tissue abnormality. IMPRESSION: 1. 5 mm nonobstructive calculus in the right renal pelvis. No hydronephrosis. Electronically signed by: Rogelia Myers MD 10/10/2024 05:32 PM EST RP Workstation: [id number]     Procedures   Medications Ordered in the ED  iohexol  (OMNIPAQUE ) 300 MG/ML solution 125 mL (100 mLs Intravenous Contrast Given 10/10/24 1704)  ketorolac  (TORADOL ) 15 MG/ML injection 15 mg (15 mg Intravenous Given 10/10/24 1745)  Medical Decision Making Amount and/or Complexity of Data Reviewed Labs: ordered. Radiology: ordered.   Patient with painless hematuria for 3 days has a history of kidney stones.  Denies any pain, fever, nausea, vomiting no urinary urgency or hesitancy or dysuria.  No vaginal discharge she is certain that this is not vaginal bleeding.  Hematuria on UA.  No evidence of infection on UA.  CBC with a leukocytosis or anemia, CMP unremarkable lipase normal  CT abdomen pelvis with contrast shows kidney stone 5 mm measured by radiology  Patient has only some mild discomfort that just started within the past hour.  Will give a dose of Toradol  and discharged home with tamsulosin  ibuprofen  Percocet Zofran  and recommendations follow-up with [facility].  Discharge instruction provided to patient.   Final diagnoses:  Kidney stone    ED Discharge Orders          Ordered    tamsulosin  (FLOMAX ) 0.4 MG CAPS capsule  Daily after supper        10/10/24 1738    ibuprofen  (ADVIL ) 600 MG tablet  Every 6 hours PRN        10/10/24 1738    oxyCODONE -acetaminophen  (PERCOCET/ROXICET) 5-325 MG tablet  Every 6 hours PRN        10/10/24 1738    ondansetron   (ZOFRAN -ODT) 4 MG disintegrating tablet  Every 8 hours PRN        10/10/24 1738               Neldon Hamp RAMAN, [place] 10/10/24 1817    Simon Lavonia SAILOR, MD 10/10/24 2202  "

## 2024-10-13 ENCOUNTER — Other Ambulatory Visit: Payer: Self-pay | Admitting: Physician Assistant

## 2024-10-13 DIAGNOSIS — E039 Hypothyroidism, unspecified: Secondary | ICD-10-CM

## 2024-11-09 ENCOUNTER — Ambulatory Visit: Admitting: Physician Assistant
